# Patient Record
Sex: Female | Born: 1975 | Race: White | Hispanic: No | Marital: Married | Smoking: Current every day smoker
Health system: Southern US, Community
[De-identification: ages and names within clinical notes are randomized; demographics above are authoritative.]

## PROBLEM LIST (undated history)

## (undated) DIAGNOSIS — E162 Hypoglycemia, unspecified: Secondary | ICD-10-CM

## (undated) DIAGNOSIS — F329 Major depressive disorder, single episode, unspecified: Secondary | ICD-10-CM

## (undated) DIAGNOSIS — I471 Supraventricular tachycardia, unspecified: Secondary | ICD-10-CM

## (undated) DIAGNOSIS — Z8489 Family history of other specified conditions: Secondary | ICD-10-CM

## (undated) DIAGNOSIS — F32A Depression, unspecified: Secondary | ICD-10-CM

## (undated) DIAGNOSIS — R51 Headache: Secondary | ICD-10-CM

## (undated) DIAGNOSIS — I499 Cardiac arrhythmia, unspecified: Secondary | ICD-10-CM

## (undated) DIAGNOSIS — R011 Cardiac murmur, unspecified: Secondary | ICD-10-CM

## (undated) DIAGNOSIS — F411 Generalized anxiety disorder: Secondary | ICD-10-CM

## (undated) DIAGNOSIS — D649 Anemia, unspecified: Secondary | ICD-10-CM

## (undated) DIAGNOSIS — F419 Anxiety disorder, unspecified: Secondary | ICD-10-CM

## (undated) DIAGNOSIS — I10 Essential (primary) hypertension: Secondary | ICD-10-CM

## (undated) DIAGNOSIS — K219 Gastro-esophageal reflux disease without esophagitis: Secondary | ICD-10-CM

## (undated) DIAGNOSIS — M4300 Spondylolysis, site unspecified: Secondary | ICD-10-CM

## (undated) HISTORY — DX: Supraventricular tachycardia: I47.1

## (undated) HISTORY — DX: Generalized anxiety disorder: F41.1

## (undated) HISTORY — PX: DILATION AND CURETTAGE OF UTERUS: SHX78

## (undated) HISTORY — PX: TUBAL LIGATION: SHX77

## (undated) HISTORY — DX: Supraventricular tachycardia, unspecified: I47.10

---

## 2003-03-10 ENCOUNTER — Other Ambulatory Visit: Payer: Self-pay

## 2003-05-07 ENCOUNTER — Other Ambulatory Visit: Payer: Self-pay

## 2003-05-17 ENCOUNTER — Other Ambulatory Visit: Payer: Self-pay

## 2003-10-29 ENCOUNTER — Ambulatory Visit: Payer: Self-pay

## 2004-09-14 ENCOUNTER — Ambulatory Visit: Payer: Self-pay

## 2005-10-25 ENCOUNTER — Other Ambulatory Visit: Payer: Self-pay

## 2005-10-25 ENCOUNTER — Emergency Department: Payer: Self-pay | Admitting: Emergency Medicine

## 2005-11-16 ENCOUNTER — Emergency Department: Payer: Self-pay | Admitting: Emergency Medicine

## 2006-01-09 ENCOUNTER — Other Ambulatory Visit: Payer: Self-pay

## 2006-01-09 ENCOUNTER — Emergency Department: Payer: Self-pay

## 2006-01-18 ENCOUNTER — Emergency Department: Payer: Self-pay | Admitting: Emergency Medicine

## 2006-02-09 ENCOUNTER — Ambulatory Visit: Payer: Self-pay | Admitting: Internal Medicine

## 2006-02-26 ENCOUNTER — Ambulatory Visit: Payer: Self-pay | Admitting: Internal Medicine

## 2006-07-08 ENCOUNTER — Encounter: Payer: Self-pay | Admitting: Maternal & Fetal Medicine

## 2006-09-16 ENCOUNTER — Encounter: Payer: Self-pay | Admitting: Maternal & Fetal Medicine

## 2006-10-14 ENCOUNTER — Encounter: Payer: Self-pay | Admitting: Maternal & Fetal Medicine

## 2006-10-14 ENCOUNTER — Observation Stay: Payer: Self-pay | Admitting: Obstetrics and Gynecology

## 2006-11-02 ENCOUNTER — Observation Stay: Payer: Self-pay | Admitting: Obstetrics and Gynecology

## 2006-11-04 ENCOUNTER — Inpatient Hospital Stay: Payer: Self-pay | Admitting: Advanced Practice Midwife

## 2007-03-12 ENCOUNTER — Emergency Department: Payer: Self-pay | Admitting: Emergency Medicine

## 2007-03-12 ENCOUNTER — Other Ambulatory Visit: Payer: Self-pay

## 2007-08-30 ENCOUNTER — Encounter: Payer: Self-pay | Admitting: Orthopedic Surgery

## 2007-09-08 ENCOUNTER — Ambulatory Visit: Payer: Self-pay | Admitting: Internal Medicine

## 2007-09-13 ENCOUNTER — Encounter: Payer: Self-pay | Admitting: Orthopedic Surgery

## 2007-09-21 ENCOUNTER — Ambulatory Visit: Payer: Self-pay | Admitting: Pain Medicine

## 2007-12-12 ENCOUNTER — Emergency Department: Payer: Self-pay | Admitting: Emergency Medicine

## 2008-07-30 ENCOUNTER — Encounter: Admission: RE | Admit: 2008-07-30 | Discharge: 2008-07-30 | Payer: Self-pay | Admitting: Dermatology

## 2008-09-13 ENCOUNTER — Encounter: Payer: Self-pay | Admitting: Internal Medicine

## 2008-09-13 ENCOUNTER — Ambulatory Visit: Payer: Self-pay | Admitting: Family Medicine

## 2009-09-30 ENCOUNTER — Ambulatory Visit: Payer: Self-pay | Admitting: Family Medicine

## 2010-06-30 ENCOUNTER — Encounter: Payer: Self-pay | Admitting: Internal Medicine

## 2010-07-01 ENCOUNTER — Ambulatory Visit (INDEPENDENT_AMBULATORY_CARE_PROVIDER_SITE_OTHER): Payer: Medicare Other | Admitting: Internal Medicine

## 2010-07-01 ENCOUNTER — Encounter: Payer: Self-pay | Admitting: Internal Medicine

## 2010-07-01 VITALS — BP 123/77 | HR 78 | Resp 12 | Ht 66.0 in | Wt 178.0 lb

## 2010-07-01 DIAGNOSIS — I341 Nonrheumatic mitral (valve) prolapse: Secondary | ICD-10-CM | POA: Insufficient documentation

## 2010-07-01 DIAGNOSIS — M549 Dorsalgia, unspecified: Secondary | ICD-10-CM

## 2010-07-01 DIAGNOSIS — I059 Rheumatic mitral valve disease, unspecified: Secondary | ICD-10-CM

## 2010-07-01 DIAGNOSIS — I471 Supraventricular tachycardia: Secondary | ICD-10-CM

## 2010-07-01 DIAGNOSIS — I498 Other specified cardiac arrhythmias: Secondary | ICD-10-CM

## 2010-07-01 MED ORDER — METOPROLOL SUCCINATE ER 50 MG PO TB24: 50.0000 mg | ORAL_TABLET | Freq: Every day | ORAL | Status: DC

## 2010-07-01 NOTE — Assessment & Plan Note (Signed)
As above.

## 2010-07-01 NOTE — Progress Notes (Signed)
HPI: April Guzman is a 35 y.o. female Seen at the request of Dr. Harl Bowie for recurrent abrupt onset onset tachypalpitations dating back approximately 20 years. These have been occurring with increasing frequency and of longer duration. Initially we yearly lasting a minute or 2. They are  Now daily lasting up to 45 minutes. They're associated with chest pressure shortness of breath presyncope and 01 occasion about a year ago with syncope. There is one documented episode apparently by [facility]. Heart was 240 beats per minute. She is being given adenosine on a couple occasions with termination.  She has no problems with exercise intolerance. She has had an echo that showed mitral valve prolapse and mitral regurgitation apparently. She does not use caffeine or other stimulants.  She has been on she thinks as many as 10 medications for control of her palpitations. Current Outpatient Prescriptions  Medication Sig Dispense Refill  . citalopram (CELEXA) 20 MG tablet Take 20 mg by mouth daily.        . ergocalciferol (VITAMIN D2) 50000 UNITS capsule Take 50,000 Units by mouth once a week.        . nadolol (CORGARD) 20 MG tablet Take 20 mg by mouth daily.        . vitamin B-12 (CYANOCOBALAMIN) 1000 MCG tablet Take 1,000 mcg by mouth daily.          No Known Allergies  Past Medical History  Diagnosis Date  . Palpitations   . Generalized anxiety disorder   . Atrial tachycardia   . Vitamin D deficiency     No past surgical history on file.  No family history on file.  History   Social History  . Marital Status: Married    Spouse Name: N/A    Number of Children: N/A  . Years of Education: N/A   Occupational History  . Not on file.   Social History Main Topics  . Smoking status: Former Games developer  . Smokeless tobacco: Not on file  . Alcohol Use: Not on file  . Drug Use: Not on file  . Sexually Active: Not on file   Other Topics Concern  . Not on file   Social History Narrative    . No narrative on file    Fourteen point review of systems was negative except as noted in HPI and PMH   PHYSICAL EXAMINATION  Blood pressure 123/77, pulse 78, resp. rate 12, height 5\' 6"  (1.676 m), weight 178 lb (80.74 kg).   Well developed and nourished in no acute distress HENT normal Neck supple with JVP-flat Carotids brisk and full without bruits Back without scoliosis or kyphosis Clear Regular rate and rhythm, no murmurs or gallops Abd-soft with active BS without hepatomegaly or midline pulsation Femoral pulses 2+ distal pulses intact No Clubbing cyanosis edema Skin-warm and dry LN-neg submandibular and supraclavicular A & Oriented CN 3-12 normal  Grossly normal sensory and motor function Affect engaging .   Electrocardiogram today demonstrated sinus rhythm at 90 with intervals of 0.13/0.08/0.38 axis is 44

## 2010-07-01 NOTE — Patient Instructions (Signed)
Your physician has recommended you make the following change in your medication:  1) Stop nadolol 2) Start metoprolol succ (toprol) 50mg  one tablet by mouth daily  Your physician recommends that you schedule a follow-up appointment in: 4 weeks in [place].

## 2010-07-01 NOTE — Assessment & Plan Note (Signed)
If in fact the patient has mitral valve prolapse, it might suggest its lateral accessory pathway. Would like to review the echo.

## 2010-07-01 NOTE — Assessment & Plan Note (Signed)
She has recurrent SVT by history. It is edematous and responsive by EMS confirming the hypothesis. This is true or not withstanding its non-documentation. 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  We discussed multiple options including alternative beta blockers and calcium blockers, antiarrhythmic drugs in the possibility of arrhythmia, and catheter ablation. We discussed the potential benefits as well as the risks including but not limited to death perforation heart block myocardial infarction and vascular injury. Given the fact that she has disabling back pain, she would need to be done under general anesthesia.  At this point she would like to try metoprolol succinate which he has not previously been on. She may well want to try an antiarrhythmic drug given the stress of having a procedure is almost overwhelming to her at this time.

## 2010-08-05 ENCOUNTER — Ambulatory Visit (INDEPENDENT_AMBULATORY_CARE_PROVIDER_SITE_OTHER): Payer: Medicare Other | Admitting: Internal Medicine

## 2010-08-05 ENCOUNTER — Encounter: Payer: Self-pay | Admitting: Internal Medicine

## 2010-08-05 VITALS — BP 137/84 | HR 61 | Ht 65.0 in | Wt 172.0 lb

## 2010-08-05 DIAGNOSIS — I498 Other specified cardiac arrhythmias: Secondary | ICD-10-CM

## 2010-08-05 DIAGNOSIS — I471 Supraventricular tachycardia: Secondary | ICD-10-CM

## 2010-08-05 MED ORDER — VERAPAMIL HCL ER 120 MG PO TBCR: 120.0000 mg | EXTENDED_RELEASE_TABLET | Freq: Every day | ORAL | Status: DC

## 2010-08-05 MED ORDER — PROPRANOLOL HCL ER 60 MG PO CP24: 60.0000 mg | ORAL_CAPSULE | Freq: Every day | ORAL | Status: DC

## 2010-08-05 MED ORDER — METOPROLOL SUCCINATE ER 50 MG PO TB24: ORAL_TABLET | ORAL | Status: DC

## 2010-08-05 NOTE — Progress Notes (Signed)
  HPI  April Guzman is a 35 y.o. 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  She  Is having much less SVT on toprol but is quite fatigued and not well able to exercise, She continues to deal with the stress of divorced parents, herself separating from her husband and 4 children  Past Medical History  Diagnosis Date  . Palpitations   . Generalized anxiety disorder   . Atrial tachycardia   . Vitamin D deficiency     No past surgical history on file.  Current Outpatient Prescriptions  Medication Sig Dispense Refill  . citalopram (CELEXA) 20 MG tablet Take 20 mg by mouth daily.        . ergocalciferol (VITAMIN D2) 50000 UNITS capsule Take 50,000 Units by mouth once a week.        . metoprolol (TOPROL-XL) 50 MG 24 hr tablet Take 1 tablet (50 mg total) by mouth daily.  30 tablet  6  . vitamin B-12 (CYANOCOBALAMIN) 1000 MCG tablet Take 1,000 mcg by mouth daily.          No Known Allergies  Review of Systems negative except from HPI and PMH  Physical Exam Well developed and well nourished in no acute distress HENT normal E scleral and icterus clear Neck Supple JVP flat; carotids brisk and full Clear to ausculation Regular rate and rhythm, no murmurs gallops or rub Soft with active bowel sounds No clubbing cyanosis and edema Alert and oriented, grossly normal motor and sensory function Skin Warm and Dry  ECG  Assessment and  Plan

## 2010-08-05 NOTE — Patient Instructions (Addendum)
1. Start taking Toprol 1/2 tablet daily to see if this will help for the SVT, if not then go with number 2. 2. Start Inderal LA 60 mg take one tablet daily. If this does not help with the SVT then try number 3. 3. Start Verapamil CR 120 mg take one tablet daily.   Follow up in 8-10 weeks with Dr. Graciela Husbands.

## 2010-08-05 NOTE — Assessment & Plan Note (Signed)
She continues to have spells and is leaning towards ablation as she continues to find drugs unsatisfactory.  We will try the following 1) decrease toprol to 25 mg 2) try inderal la 60 if the above doesn't satisfy, and if inderal fails 3) verapamil 120  We will revisit ablation at her next visit

## 2010-10-10 ENCOUNTER — Encounter: Payer: Self-pay | Admitting: Internal Medicine

## 2010-10-10 ENCOUNTER — Ambulatory Visit (INDEPENDENT_AMBULATORY_CARE_PROVIDER_SITE_OTHER): Payer: Medicare Other | Admitting: Internal Medicine

## 2010-10-10 VITALS — BP 149/97 | HR 69 | Ht 66.0 in | Wt 177.0 lb

## 2010-10-10 DIAGNOSIS — I498 Other specified cardiac arrhythmias: Secondary | ICD-10-CM

## 2010-10-10 DIAGNOSIS — R030 Elevated blood-pressure reading, without diagnosis of hypertension: Secondary | ICD-10-CM

## 2010-10-10 DIAGNOSIS — I471 Supraventricular tachycardia: Secondary | ICD-10-CM

## 2010-10-10 NOTE — Progress Notes (Signed)
HPI: April Guzman is a 35 y.o. female [place] for recurrent abrupt onset onset tachypalpitations dating back approximately 20 years. These have been occurring with increasing frequency and of longer duration. Initially we yearly lasting a minute or 2. They are  Now daily lasting up to 45 minutes. They're associated with chest pressure shortness of breath presyncope and on 1 occasion about a year ago with syncope. There is one documented episode apparently by [facility]. Heart was 240 beats per minute. She is being given adenosine on a couple occasions with termination.  They are frog neg and diuretic negative  She has no problems with exercise intolerance. She has had an echo that showed mitral valve prolapse and mitral regurgitation apparently. She does not use caffeine or other stimulants.  She has recently noted LH following vigorous walking and with standing  She has been on she thinks as many as 10 medications for control of her palpitations. Current Outpatient Prescriptions  Medication Sig Dispense Refill  . citalopram (CELEXA) 20 MG tablet Take 20 mg by mouth daily.        . ergocalciferol (VITAMIN D2) 50000 UNITS capsule Take 50,000 Units by mouth once a week.        . metoprolol (TOPROL-XL) 50 MG 24 hr tablet Take 1/2 tablet daily.  30 tablet  6  . vitamin B-12 (CYANOCOBALAMIN) 1000 MCG tablet Take 1,000 mcg by mouth daily.        . propranolol (INDERAL LA) 60 MG 24 hr capsule Take 1 capsule (60 mg total) by mouth daily.  30 capsule  6  . verapamil (CALAN-SR) 120 MG CR tablet Take 1 tablet (120 mg total) by mouth daily.  30 tablet  6    No Known Allergies  Past Medical History  Diagnosis Date  . Generalized anxiety disorder   . Vitamin D deficiency   . Supraventricular tachycardia     prob conealed accessory pathway;  previous drugs atenolol, nadolol, metoprolol Tartrate; diltiazem    History reviewed. No pertinent past surgical history.  History reviewed. No  pertinent family history.  History   Social History  . Marital Status: Married    Spouse Name: N/A    Number of Children: N/A  . Years of Education: N/A   Occupational History  . Not on file.   Social History Main Topics  . Smoking status: Former Games developer  . Smokeless tobacco: Not on file  . Alcohol Use: No  . Drug Use: No  . Sexually Active: Not on file   Other Topics Concern  . Not on file   Social History Narrative  . No narrative on file    Fourteen point review of systems was negative except as noted in HPI and PMH   PHYSICAL EXAMINATION  Blood pressure 149/97, pulse 69, height 5\' 6"  (1.676 m), weight 177 lb (80.287 kg).   Well developed and nourished in no acute distress HENT normal Neck supple with JVP-flat Carotids brisk and full without bruits Back without scoliosis or kyphosis Clear Regular rate and rhythm, no murmurs or gallops Abd-soft with active BS without hepatomegaly or midline pulsation Femoral pulses 2+ distal pulses intact No Clubbing cyanosis edema Skin-warm and dry LN-neg submandibular and supraclavicular A & Oriented CN 3-12 normal  Grossly normal sensory and motor function Affect engaging .   Electrocardiogram today demonstrated sinus rhythm without preexcitation

## 2010-10-10 NOTE — Assessment & Plan Note (Signed)
Will follw

## 2010-10-10 NOTE — Assessment & Plan Note (Addendum)
She continues to lean towards ablation with fatigue will switvch to verapamil and she will let us know prob concealed accessory pathway  Would prob need to be done under [place] with back issues

## 2010-10-10 NOTE — Patient Instructions (Signed)
The patient will try the verapamil and call if not helping to set up an ablation.

## 2010-11-18 ENCOUNTER — Telehealth: Payer: Self-pay | Admitting: Cardiovascular Disease

## 2010-11-18 NOTE — Telephone Encounter (Signed)
Pt calling to schedule an ablation with Dr Graciela Husbands

## 2010-11-19 ENCOUNTER — Other Ambulatory Visit: Payer: Self-pay | Admitting: Internal Medicine

## 2010-11-19 NOTE — Telephone Encounter (Signed)
Spoke to pt this AM, she was given Verapamil at last ov and was told to call if this was not helping. She says her HR at times is "so slow I cannot function, and then if I do anything active it flies." Pt wants to go ahead with SVT ablation. Notified pt Dr. Graciela Husbands in office today, I will discuss date/time and will call her back with details.

## 2010-11-20 NOTE — Telephone Encounter (Signed)
Per Dr. Graciela Husbands, pt is scheduled for SVT ablation 11/21. Attempted to contact pt, LMOM TCB. Will have pt come in for labs/instructions on 11/15.

## 2010-11-20 NOTE — Telephone Encounter (Signed)
Pt called stating she will be out of town for Thanksgiving and forgot this. She would like to r/s for 11/29. Notified pt I will r/s and call her back with details.

## 2010-11-21 ENCOUNTER — Encounter: Payer: Self-pay | Admitting: *Deleted

## 2010-11-21 ENCOUNTER — Telehealth: Payer: Self-pay | Admitting: *Deleted

## 2010-11-21 DIAGNOSIS — Z01810 Encounter for preprocedural cardiovascular examination: Secondary | ICD-10-CM

## 2010-11-21 DIAGNOSIS — I471 Supraventricular tachycardia: Secondary | ICD-10-CM

## 2010-11-21 NOTE — Telephone Encounter (Signed)
Called pt to verify meds prior to her SVT ablation. Pt states she is not taking verapamil, or metoprolol anymore since no medications have worked. Will update her med list.

## 2010-12-02 ENCOUNTER — Other Ambulatory Visit: Payer: Self-pay | Admitting: *Deleted

## 2010-12-02 ENCOUNTER — Encounter (HOSPITAL_COMMUNITY): Payer: Self-pay | Admitting: Pharmacist

## 2010-12-02 DIAGNOSIS — I471 Supraventricular tachycardia: Secondary | ICD-10-CM

## 2010-12-08 ENCOUNTER — Telehealth: Payer: Self-pay | Admitting: *Deleted

## 2010-12-08 ENCOUNTER — Ambulatory Visit (INDEPENDENT_AMBULATORY_CARE_PROVIDER_SITE_OTHER): Payer: Medicare Other | Admitting: *Deleted

## 2010-12-08 DIAGNOSIS — Z01810 Encounter for preprocedural cardiovascular examination: Secondary | ICD-10-CM

## 2010-12-08 DIAGNOSIS — I498 Other specified cardiac arrhythmias: Secondary | ICD-10-CM

## 2010-12-08 DIAGNOSIS — I471 Supraventricular tachycardia: Secondary | ICD-10-CM

## 2010-12-08 NOTE — Telephone Encounter (Signed)
Patient is scheduled for an ablation on Thursday with Dr Graciela Husbands for SVT.  Due to scheduling issues, Dr Graciela Husbands will be out of the lab that day.  Called and offered patient to have the procedure done as scheduled with Dr Ladona Ridgel.  She wants to think about it.  She will call Aundra Millet in the [place] office with her decision.

## 2010-12-09 LAB — CBC WITH DIFFERENTIAL
Eos: 4 % (ref 0–7)
Eosinophils Absolute: 0.2 10*3/uL (ref 0.0–0.4)
Immature Grans (Abs): 0 10*3/uL (ref 0.0–0.1)
Immature Granulocytes: 0 % (ref 0–2)
Lymphs: 28 % (ref 14–46)
MCH: 26.1 pg — ABNORMAL LOW (ref 26.6–33.0)
Monocytes Absolute: 0.5 10*3/uL (ref 0.1–1.0)
Neutrophils Relative %: 58 % (ref 40–74)
Platelets: 339 10*3/uL (ref 140–415)
RBC: 4.48 x10E6/uL (ref 3.77–5.28)

## 2010-12-09 LAB — BASIC METABOLIC PANEL
BUN/Creatinine Ratio: 29 — ABNORMAL HIGH (ref 8–20)
BUN: 17 mg/dL (ref 6–20)
CO2: 18 mmol/L — ABNORMAL LOW (ref 20–32)
Calcium: 9 mg/dL (ref 8.7–10.2)
Chloride: 106 mmol/L (ref 97–108)
Creatinine, Ser: 0.59 mg/dL (ref 0.57–1.00)
Glucose: 80 mg/dL (ref 65–99)

## 2010-12-09 LAB — PROTIME-INR: INR: 0.9 (ref 0.8–1.2)

## 2010-12-10 NOTE — Telephone Encounter (Signed)
Patient decided to leave case as scheduled.  Dr Ladona Ridgel will meet with her in short stay prior to procedure to answer any questions.

## 2010-12-11 ENCOUNTER — Encounter (HOSPITAL_COMMUNITY)
Admission: RE | Disposition: A | Discharge status: Home / Self Care | Payer: Self-pay | Source: Ambulatory Visit | Attending: Internal Medicine

## 2010-12-11 ENCOUNTER — Encounter (HOSPITAL_COMMUNITY): Payer: Self-pay | Admitting: General Practice

## 2010-12-11 ENCOUNTER — Ambulatory Visit (HOSPITAL_COMMUNITY)
Admission: RE | Admit: 2010-12-11 | Discharge: 2010-12-11 | Disposition: A | Discharge status: Home / Self Care | Payer: Medicare Other | Source: Ambulatory Visit | Attending: Internal Medicine | Admitting: Internal Medicine

## 2010-12-11 ENCOUNTER — Encounter (HOSPITAL_COMMUNITY): Payer: Self-pay

## 2010-12-11 ENCOUNTER — Ambulatory Visit (HOSPITAL_COMMUNITY): Payer: Medicare Other

## 2010-12-11 ENCOUNTER — Encounter (HOSPITAL_COMMUNITY): Payer: Self-pay | Admitting: Certified Registered"

## 2010-12-11 DIAGNOSIS — E559 Vitamin D deficiency, unspecified: Secondary | ICD-10-CM | POA: Insufficient documentation

## 2010-12-11 DIAGNOSIS — I498 Other specified cardiac arrhythmias: Secondary | ICD-10-CM | POA: Insufficient documentation

## 2010-12-11 DIAGNOSIS — F411 Generalized anxiety disorder: Secondary | ICD-10-CM | POA: Insufficient documentation

## 2010-12-11 DIAGNOSIS — I471 Supraventricular tachycardia: Secondary | ICD-10-CM

## 2010-12-11 DIAGNOSIS — E162 Hypoglycemia, unspecified: Secondary | ICD-10-CM

## 2010-12-11 HISTORY — DX: Anemia, unspecified: D64.9

## 2010-12-11 HISTORY — DX: Hypoglycemia, unspecified: E16.2

## 2010-12-11 HISTORY — PX: SUPRAVENTRICULAR TACHYCARDIA ABLATION: SHX5492

## 2010-12-11 HISTORY — DX: Essential (primary) hypertension: I10

## 2010-12-11 HISTORY — PX: CARDIAC ELECTROPHYSIOLOGY MAPPING AND ABLATION: SHX1292

## 2010-12-11 HISTORY — DX: Anxiety disorder, unspecified: F41.9

## 2010-12-11 HISTORY — DX: Cardiac murmur, unspecified: R01.1

## 2010-12-11 HISTORY — DX: Headache: R51

## 2010-12-11 SURGERY — SUPRAVENTRICULAR TACHYCARDIA ABLATION
Anesthesia: General

## 2010-12-11 MED ORDER — MIDAZOLAM HCL 5 MG/5ML IJ SOLN
INTRAMUSCULAR | Status: DC | PRN
  Administered 2010-12-11 (×2): 2 mg via INTRAVENOUS
  Administered 2010-12-11 (×2): 1 mg via INTRAVENOUS

## 2010-12-11 MED ORDER — PROPOFOL 10 MG/ML IV EMUL
INTRAVENOUS | Status: DC | PRN
  Administered 2010-12-11: 75 ug/kg/min via INTRAVENOUS

## 2010-12-11 MED ORDER — LACTATED RINGERS IV SOLN: INTRAVENOUS | Status: DC

## 2010-12-11 MED ORDER — BUPIVACAINE HCL (PF) 0.25 % IJ SOLN
INTRAMUSCULAR | Status: AC
  Filled 2010-12-11: qty 30

## 2010-12-11 MED ORDER — SODIUM CHLORIDE 0.9 % IV SOLN: INTRAVENOUS | Status: DC

## 2010-12-11 MED ORDER — HEPARIN (PORCINE) IN NACL 2-0.9 UNIT/ML-% IJ SOLN
INTRAMUSCULAR | Status: AC
  Filled 2010-12-11: qty 1000

## 2010-12-11 MED ORDER — ACETAMINOPHEN 325 MG PO TABS
650.0000 mg | ORAL_TABLET | Freq: Four times a day (QID) | ORAL | Status: DC | PRN
  Filled 2010-12-11: qty 2

## 2010-12-11 MED ORDER — CITALOPRAM HYDROBROMIDE 20 MG PO TABS
20.0000 mg | ORAL_TABLET | Freq: Every day | ORAL | Status: DC
  Filled 2010-12-11: qty 1

## 2010-12-11 MED ORDER — LACTATED RINGERS IV SOLN
INTRAVENOUS | Status: DC | PRN
  Administered 2010-12-11: 11:00:00 via INTRAVENOUS

## 2010-12-11 MED ORDER — FENTANYL CITRATE 0.05 MG/ML IJ SOLN
INTRAMUSCULAR | Status: DC | PRN
  Administered 2010-12-11 (×2): 25 ug via INTRAVENOUS
  Administered 2010-12-11: 100 ug via INTRAVENOUS

## 2010-12-11 MED ORDER — HYDROXYUREA 500 MG PO CAPS
ORAL_CAPSULE | ORAL | Status: AC
  Filled 2010-12-11: qty 1

## 2010-12-11 MED ORDER — ACETAMINOPHEN 325 MG PO TABS
650.0000 mg | ORAL_TABLET | ORAL | Status: DC | PRN
  Administered 2010-12-11: 650 mg via ORAL

## 2010-12-11 MED ORDER — ONDANSETRON HCL 4 MG/2ML IJ SOLN: 4.0000 mg | Freq: Four times a day (QID) | INTRAMUSCULAR | Status: DC | PRN

## 2010-12-11 NOTE — Op Note (Signed)
NAMECAMARIE, April Guzman                  ACCOUNT NO.:  [id number]  MEDICAL RECORD NO.:  [id number]  LOCATION:  2006                         FACILITY:  [facility]  PHYSICIAN:  Doylene Canning. 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  INDUCTION:  The patient is a very pleasant 35 year old woman with a history of tachy-palpitations and documented SVT at rates of over 200 beats per minute.  They start and stop suddenly, and they have been refractory to medical therapy.  She is now referred for catheter ablation.  PROCEDURE:  After informed consent was obtained, the patient was taken to the diagnostic EP lab in a fasting state.  After usual preparation and draping, intravenous fentanyl and midazolam were given for sedation. In addition, propofol was administered by the nurse anesthetist.  A 6- French hexapolar catheter was inserted percutaneously into the right jugular vein and advanced to the coronary sinus.  A 6-French quadripolar catheter was inserted percutaneously in the right femoral vein and advanced to the right ventricle.  A 6-French quadripolar catheter was inserted percutaneously in the right femoral vein and advanced to the His-bundle region.  After measurement of basic intervals, rapid ventricular pacing was carried out from the right ventricle and stepwise decreased down to 320 msec where VA Wenckebach was observed.  During rapid ventricular pacing, the atrial activation sequence was midline and decremental.  Programmed ventricular stimulation was carried out in the right ventricle at a base drive cycle length of 161 msec.  The S1-S2 interval stepwise decreased from 400 msec down to 220 msec where ventricular refractoriness was observed.  During programmed ventricular stimulation, the  atrial activation again remained midline and decremental.  Programmed atrial stimulation was carried out in the coronary sinus at a base drive cycle length of 096 msec.  The S1-S2 interval stepwise decreased from 440 msec down to 260 msec.  During programmed atrial stimulation, there were multiple AH jumps, echo beats, and double echo beats.  There was no inducible SVT initially.  Rapid atrial pacing was carried out in the coronary sinus at a base drive cycle length of 045 msec and stepwise decreased down to 410 msec where AV Wenckebach was observed.  During rapid atrial pacing, the PR interval was greater than the RR interval, but there was no inducible SVT.  Isoproterenol was infused at rates between 1 and 4 mics per minute. Rapid atrial pacing was then carried out resulting in the initiation of SVT.  This was a narrow QRS tachycardia at a rate of 200-210 beats per minute.  Review of the atrial electrograms during tachycardia demonstrated a very short VA interval and midline atrial activation. PVCs were then placed at the time of His-bundle refractoriness, which did not preexcite the atrium.  Ventricular pacing during SVT resulted in a VA-AV conduction sequence.  At times, ventricular pacing would terminate the tachycardia.  With the above findings, a diagnosis of AV node reentrant tachycardia was made.  A 7-French quadripolar ablation catheter  was then maneuvered percutaneously through the right femoral vein and advanced into the right atrium.  Mapping was carried out.  A total of 5 RF energy applications were given Koch's triangle between site 7 and 8.  During RF energy application, there was accelerated junctional rhythm.  Following this, the patient was observed for approximately 30 minutes.  During this time, additional rapid atrial pacing and programmed atrial stimulation and rapid ventricular pacing and programmed ventricular stimulation were carried out and demonstrated no  inducible SVT.  In addition, the PR interval was now less than the RR interval.  There were no jumps and there were no echoes noted.  The catheters were then removed.  Hemostasis was assured.  The patient was returned to her room in satisfactory condition.  COMPLICATIONS:  There were no immediate procedure complications.  RESULTS:  A.  Baseline ECG.  Baseline ECG demonstrates sinus rhythm with normal axis intervals.  There was no preexcitation. B.  Baseline intervals.  Sinus node cycle length was 837 msec.  The QRS duration was 90 msec.  The PR interval was 140 msec.  The AAH interval was 83 msec and the HV interval was 36 msec. C.  Rapid ventricular pacing.  Rapid ventricular pacing was carried out in the right ventricle, demonstrating a VA Wenckebach cycle length of 320 msec.  During rapid ventricular pacing, the atrial activation sequence was midline and decremental. D. Programmed ventricular stimulation.  Programmed ventricular stimulation was carried out from the right ventricle at a base drive cycle length of 161 msec.  The S1-S2 interval stepwise decreased from 440 msec down to 220 msec where the ventricular refractoriness was observed.  During programmed ventricular stimulation, the atrial activation remained midline and decremental.  There were VA jumps noted. E.  Rapid atrial pacing.  Rapid atrial pacing was carried out from the coronary sinus at a base drive cycle length of 096 msec and stepwise decreased down to 410 msec where AV Wenckebach was observed.  Following the initiation of isoproterenol, rapid atrial pacing was carried out resulting in the initiation of SVT. F.  Programmed atrial stimulation.  Programmed atrial stimulation was carried out from the coronary sinus at a base drive cycle length of 045 msec.  The S1-S2 interval was stepwise decreased down to 270 msec with AV node ERP was observed.  During programmed atrial stimulation, the PR interval was greater than  the RR interval, and there was double and triple echo beats.  Once isoproterenol was infused, programmed atrial stimulation resulted in induction of SVT. G.  Arrhythmias observed. 1. AV node reentrant tachycardia initiation was with rapid atrial     pacing on isoproterenol.  Duration was sustained.  Termination was     ventricular pacing.     a.     Mapping.  Mapping of Koch's triangle demonstrated usual size      and orientation.     b.     RF energy application.  Total of 5 RF energy applications      were delivered to sites 7 through 9 in Koch's triangle.  During RF      energy application, there was prolonged accelerated junctional      rhythm.  CONCLUSION:  This study demonstrates successful electrophysiologic study and RF catheter ablation of inducible AV node reentry tachycardia with a total of 5 RF energy applications delivered to sites 7 through 8 Koch's triangle.  This resulted in accelerated junctional rhythm, rendering the SVT noninducible, and rendering the slow pathway  conduction absent.     Doylene Canning. Ladona Ridgel, MD    GWT/MEDQ  D:  12/11/2010  T:  12/11/2010  Job:  [id number]  cc:   Duke Salvia, MD, [facility]

## 2010-12-11 NOTE — Discharge Summary (Signed)
   Discharge Summary   Patient ID: April Guzman MRN: [id number], DOB/AGE: 37-Jun-1977 37 y.o. Admit date: 12/11/2010 D/C date:     12/11/2010   Primary Discharge Diagnoses:  1. SVT s/p ablation 12/11/10  Secondary Discharge Diagnoses:  1. Generalized anxiety disorder 2. Vit D deficiency  Hospital Course: April Guzman is a 35 y/o F with history of SVT who presented with increasing palpitations in frequency and duration. They have been associated with chest pressure, SOB, and presyncope and on one occasion with syncope. She has been given adenosine on a couple occasions with termination. They are frog negative and diuretic negative. Dr. Ladona Ridgel felt she was a good candidate for SVT ablation and brought her in for this procedure 12/11/10. She underwent it successfully and did well post-op, ambulating without difficulty. Dr. Ladona Ridgel has seen & examined her and feels she is stable for discharge.  He feels she does not need any AV nodal blocking agents at discharge. She was previously on them but has not been on them recently, so no changes were made to home med list. HyperK+ noted on pre-admit labs -- has been addressed by office by RN, sent to MD desktop.   Discharge Vitals: Blood pressure 109/64, pulse 77, temperature 98.8 F (37.1 C), temperature source Oral, resp. rate 18, height 5\' 5"  (1.651 m), weight 177 lb (80.287 kg), last menstrual period 12/08/2010, SpO2 99.00%.  Labs: Lab Results  Component Value Date   WBC 5.1 12/08/2010   HGB 11.7 12/08/2010   HCT 35.3 12/08/2010   MCV 79 12/08/2010   PLT 339 12/08/2010    Lab 12/08/10 0842  NA 141  K 5.4*  CL 106  CO2 18*  BUN 17  CREATININE 0.59  CALCIUM 9.0  PROT --  BILITOT --  ALKPHOS --  ALT --  AST --  GLUCOSE 80    Diagnostic Studies/Procedures:  1. SVT ablation  Discharge Medications   Current Discharge Medication List    CONTINUE these medications which have NOT CHANGED   Details  acetaminophen (TYLENOL) 325 MG tablet  Take 650 mg by mouth every 6 (six) hours as needed. For headache and pain     citalopram (CELEXA) 20 MG tablet Take 20 mg by mouth daily.       STOP taking these medications     CYANOCOBALAMIN PO         Disposition   The patient will be discharged in stable condition to home. Discharge Orders    Future Orders Please Complete By Expires   Diet - low sodium heart healthy      Increase activity slowly      Comments:   No driving for 1 day. No lifting over 5 lbs for 3 days.   Discharge wound care:      Comments:   Keep procedure site clean & dry. If you notice increased pain, swelling, bleeding or pus, call/return!  You may shower, but no soaking baths/hot tubs/pools for 1 week.       Follow-up Information    Follow up with Sherryl Manges, MD. (our office will call you)    Contact information:   [address], [address] [place] [address] [phone]            Duration of Discharge Encounter: Greater than 30 minutes including physician and PA time.  Signed, Ronie Spies PA-C 12/11/2010, 7:56 PM

## 2010-12-11 NOTE — Telephone Encounter (Signed)
Ok noted, thanks

## 2010-12-11 NOTE — Anesthesia Postprocedure Evaluation (Signed)
  Anesthesia Post-op Note  Patient: April Guzman  Procedure(s) Performed:  SUPRAVENTRICULAR TACHYCARDIA ABLATION  Patient Location: PACU  Anesthesia Type: MAC  Level of Consciousness: awake  Airway and Oxygen Therapy: Patient Spontanous Breathing  Post-op Pain: none  Post-op Assessment: Post-op Vital signs reviewed  Post-op Vital Signs: stable  Complications: No apparent anesthesia complications

## 2010-12-11 NOTE — H&P (Signed)
April Guzman is a 35 y.o. female  Who presents for evaluation and catheter ablation of SVT. She has a h/o recurrent abrupt onset onset tachypalpitations dating back approximately 20 years. These have been occurring with increasing frequency and of longer duration. Initially we yearly lasting a minute or 2. They are Now daily lasting up to 45 minutes. They're associated with chest pressure shortness of breath presyncope and on 1 occasion about a year ago with syncope. There is one documented episode apparently by [facility]. Heart was 240 beats per minute. She is being given adenosine on a couple occasions with termination. They are frog neg and diuretic negative  She has no problems with exercise intolerance. She has had an echo that showed mitral valve prolapse and mitral regurgitation apparently. She does not use caffeine or other stimulants.  She has recently noted LH following vigorous walking and with standing  She has been on she thinks as many as 10 medications for control of her palpitations.  Current Outpatient Prescriptions   Medication  Sig  Dispense  Refill   .  citalopram (CELEXA) 20 MG tablet  Take 20 mg by mouth daily.     .  ergocalciferol (VITAMIN D2) 50000 UNITS capsule  Take 50,000 Units by mouth once a week.     .  metoprolol (TOPROL-XL) 50 MG 24 hr tablet  Take 1/2 tablet daily.  30 tablet  6   .  vitamin B-12 (CYANOCOBALAMIN) 1000 MCG tablet  Take 1,000 mcg by mouth daily.     .  propranolol (INDERAL LA) 60 MG 24 hr capsule  Take 1 capsule (60 mg total) by mouth daily.  30 capsule  6   .  verapamil (CALAN-SR) 120 MG CR tablet  Take 1 tablet (120 mg total) by mouth daily.  30 tablet  6    No Known Allergies  Past Medical History   Diagnosis  Date   .  Generalized anxiety disorder    .  Vitamin D deficiency    .  Supraventricular tachycardia      prob conealed accessory pathway; previous drugs atenolol, nadolol, metoprolol Tartrate; diltiazem    History reviewed. No  pertinent past surgical history.  History reviewed. No pertinent family history.  History    Social History   .  Marital Status:  Married     Spouse Name:  N/A     Number of Children:  N/A   .  Years of Education:  N/A    Occupational History   .  Not on file.    Social History Main Topics   .  Smoking status:  Former Games developer   .  Smokeless tobacco:  Not on file   .  Alcohol Use:  No   .  Drug Use:  No   .  Sexually Active:  Not on file    Other Topics  Concern   .  Not on file    Social History Narrative   .  No narrative on file    Fourteen point review of systems was negative except as noted in HPI and PMH  PHYSICAL EXAMINATION  Blood pressure 149/97, pulse 69, height 5\' 6"  (1.676 m), weight 177 lb (80.287 kg).  Well developed and nourished in no acute distress  HENT normal  Neck supple with JVP-flat  Carotids brisk and full without bruits  Back without scoliosis or kyphosis  Clear  Regular rate and rhythm, no murmurs or gallops  Abd-soft with active  BS without hepatomegaly or midline pulsation  Femoral pulses 2+ distal pulses intact  No Clubbing cyanosis edema  Skin-warm and dry  LN-neg submandibular and supraclavicular  A & Oriented CN 3-12 normal Grossly normal sensory and motor function  Affect engaging  . ECG - reviewed. NSR without pre-excitation.  A/P 1. SVT - I have discussed the risks/benefits/goals/expectations of SVT ablation with the patient and her family and she wishes to proceed.  2. Back Pain - with her h/o back pain, will ask the anesthesia service if they can help [place] by providing moderate to deep sedation.

## 2010-12-11 NOTE — Preoperative (Signed)
Beta Blockers   Reason not to administer Beta Blockers:Not Applicable 

## 2010-12-11 NOTE — Interval H&P Note (Signed)
History and Physical Interval Note:  12/11/2010 10:35 AM  April Guzman  has presented today for surgery, with the diagnosis of svt  The various methods of treatment have been discussed with the patient and family. After consideration of risks, benefits and other options for treatment, the patient has consented to  Procedure(s): SUPRAVENTRICULAR TACHYCARDIA ABLATION as a surgical intervention .  The patients' history has been reviewed, patient examined, no change in status, stable for surgery.  I have reviewed the patients' chart and labs.  Questions were answered to the patient's satisfaction.     Lewayne Bunting

## 2010-12-11 NOTE — Anesthesia Preprocedure Evaluation (Addendum)
Anesthesia Evaluation  Patient identified by MRN, date of birth, ID band Patient awake    Reviewed: Allergy & Precautions, H&P , NPO status , Patient's Chart, lab work & pertinent test results, reviewed documented beta blocker date and time   Airway Mallampati: I TM Distance: >3 FB Neck ROM: Full  Mouth opening: Limited Mouth Opening  Dental  (+) Teeth Intact   Pulmonary neg pulmonary ROS,  clear to auscultation        Cardiovascular Regular Normal- Systolic murmurs SVT   Neuro/Psych PSYCHIATRIC DISORDERS Anxiety Negative Neurological ROS     GI/Hepatic negative GI ROS, Neg liver ROS,   Endo/Other  Negative Endocrine ROS  Renal/GU negative Renal ROS     Musculoskeletal   Abdominal   Peds  Hematology   Anesthesia Other Findings   Reproductive/Obstetrics                          Anesthesia Physical Anesthesia Plan  ASA: II  Anesthesia Plan: MAC   Post-op Pain Management:    Induction:   Airway Management Planned: Simple Face Mask, Nasal Cannula and Natural Airway  Additional Equipment:   Intra-op Plan:   Post-operative Plan:   Informed Consent: I have reviewed the patients History and Physical, chart, labs and discussed the procedure including the risks, benefits and alternatives for the proposed anesthesia with the patient or authorized representative who has indicated his/her understanding and acceptance.     Plan Discussed with: CRNA, Anesthesiologist and Surgeon  Anesthesia Plan Comments:         Anesthesia Quick Evaluation

## 2010-12-11 NOTE — Transfer of Care (Signed)
Immediate Anesthesia Transfer of Care Note  Patient: April Guzman  Procedure(s) Performed:  SUPRAVENTRICULAR TACHYCARDIA ABLATION  Patient Location: PACU  Anesthesia Type: MAC  Level of Consciousness: awake, alert , oriented and patient cooperative  Airway & Oxygen Therapy: Patient Spontanous Breathing  Post-op Assessment: Report given to PACU RN, Post -op Vital signs reviewed and stable and Patient moving all extremities  Post vital signs: Reviewed and stable  Complications: No apparent anesthesia complications

## 2010-12-11 NOTE — Op Note (Signed)
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 [id number].

## 2010-12-11 NOTE — Anesthesia Procedure Notes (Signed)
Procedure Name: MAC Date/Time: 12/11/2010 11:32 AM Performed by: Einar Crow Pre-anesthesia Checklist: Patient identified, Emergency Drugs available, Suction available and Patient being monitored Patient Re-evaluated:Patient Re-evaluated prior to inductionOxygen Delivery Method: Nasal Cannula

## 2010-12-17 ENCOUNTER — Encounter: Payer: Self-pay | Admitting: Internal Medicine

## 2010-12-17 ENCOUNTER — Ambulatory Visit (INDEPENDENT_AMBULATORY_CARE_PROVIDER_SITE_OTHER): Payer: Medicare Other | Admitting: Internal Medicine

## 2010-12-17 VITALS — BP 124/78 | HR 66 | Ht 65.0 in | Wt 174.2 lb

## 2010-12-17 DIAGNOSIS — E875 Hyperkalemia: Secondary | ICD-10-CM

## 2010-12-17 DIAGNOSIS — I498 Other specified cardiac arrhythmias: Secondary | ICD-10-CM

## 2010-12-17 DIAGNOSIS — E872 Acidosis, unspecified: Secondary | ICD-10-CM | POA: Insufficient documentation

## 2010-12-17 DIAGNOSIS — R221 Localized swelling, mass and lump, neck: Secondary | ICD-10-CM

## 2010-12-17 DIAGNOSIS — R220 Localized swelling, mass and lump, head: Secondary | ICD-10-CM

## 2010-12-17 DIAGNOSIS — I471 Supraventricular tachycardia: Secondary | ICD-10-CM

## 2010-12-17 NOTE — Assessment & Plan Note (Signed)
Status post RFCA.  Without clinical recurrences. Hopefully this is done. She is off medication

## 2010-12-17 NOTE — Assessment & Plan Note (Signed)
She has fullness following IJ puncture. There is swelling of the sternocleidomastoid. The issue would be airway compression. We'll obtain a CAT scan. Otherwise we'll just follow it and see her she doesn't next week or so

## 2010-12-17 NOTE — Assessment & Plan Note (Signed)
As above.

## 2010-12-17 NOTE — Assessment & Plan Note (Addendum)
Repeat B. Met if still abnormal will refer

## 2010-12-17 NOTE — Progress Notes (Signed)
  HPI  April Guzman is a 35 y.o. female Seen  following catheter ablation for SVT last week done by Dr. Ladona Ridgel in my absence.that went well and she had no recurrent tachycardia palpitations.  She does however complain of discomfort in her right neck with pain radiating up to the top of her head and a warmth. She has had no respiratory difficulties or difficulty swallowing.  Her preoperative blood work was notable for hyperkalemia-5.4 and mild acidemia with a bicarbonate of 18  Past Medical History  Diagnosis Date  . Generalized anxiety disorder   . Vitamin D deficiency   . 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 Hypertension   . Heart murmur   . Hypoglycemia 12/11/10    "I've changed my diet to deal w/it and it works"  . Anemia   . Headache     Past Surgical History  Procedure Date  . Cardiac electrophysiology mapping and ablation 12/11/10  . Cesarean section 2004; 2008    Current Outpatient Prescriptions  Medication Sig Dispense Refill  . acetaminophen (TYLENOL) 325 MG tablet Take 650 mg by mouth every 6 (six) hours as needed. For headache and pain       . citalopram (CELEXA) 20 MG tablet Take 20 mg by mouth daily.         Allergies  Allergen Reactions  . Latex Itching  . Macrobid (Nitrofurantoin Monohydrate Macrocrystals) Nausea Only and Rash    Review of Systems negative except from HPI and PMH  Physical Exam Well developed and well nourished in no acute distress HENT normal E scleral and icterus clear Neck Supple but there is fullness and tenderness in the right neck and swelling of the sternocleidomastoid.  there is no stridor and no evidence of swelling inside the oropharynx JVP flat; carotids brisk and full Clear to ausculation Regular rate and rhythm, no murmurs gallops or rub Soft with active bowel sounds No clubbing cyanosis none Edema Alert and oriented, grossly normal motor and sensory function Skin Warm and Dry  ECG  demonstrates sinus rhythm at 66 Intervals 0.13/09/0.40 Assessment and  Plan

## 2010-12-17 NOTE — Patient Instructions (Signed)
CT scan of neck (with contrast) 12/18/10 @ 1:30 at the [facility] location. Arrive at 1:15 pm.  Nothing to eat or drink 4 hrs prior. Bring med list.   We will call with results of CT. Call the office next week to let us know how you are feeling.

## 2010-12-18 ENCOUNTER — Ambulatory Visit: Payer: Self-pay | Admitting: Internal Medicine

## 2010-12-18 LAB — BASIC METABOLIC PANEL
BUN/Creatinine Ratio: 23 — ABNORMAL HIGH (ref 8–20)
BUN: 14 mg/dL (ref 6–20)
CO2: 21 mmol/L (ref 20–32)
Creatinine, Ser: 0.6 mg/dL (ref 0.57–1.00)
GFR calc Af Amer: 135 mL/min/{1.73_m2} (ref >59)

## 2010-12-23 ENCOUNTER — Telehealth: Payer: Self-pay | Admitting: *Deleted

## 2010-12-23 MED ORDER — METOPROLOL TARTRATE 25 MG PO TABS: 25.0000 mg | ORAL_TABLET | Freq: Two times a day (BID) | ORAL | Status: DC | PRN

## 2010-12-23 NOTE — Telephone Encounter (Signed)
Pt called this AM stating yesterday is first episode since ablation that she felt "heart fluttering." Progressed in frequency and intensity throughout the day. (Pt had SVT ablation 2 weeks prior with [facility], had f/u in office 12/5 with [facility] and had no c/o at that time, doing well, EKG showed NSR HR 66). Pt was "doing things around the house, minimal exertion when these occurred." One time she was at [facility] and this was most intense, heart fluttering and "a lot of chest pain and felt like about to pass out." Symptoms same as before ablation EXCEPT she had never had the chest pain. Pt states unsure if this is r/t anxiety. Pt was never able to check BP or HR, she thinks by palpating HR never got over 80.   After discussing with Dr. Mariah Milling in office, advised pt to monitor symptoms, need to determine if this is one time episode or if recurrent after ablation. If recurrent, will need to monitor again to capture rhythm. Pt does not take any cardiac meds at this time but does have metoprolol succ on hand. We suggested metop tart 25 1-2 PRN for recurrent episodes, she would like to try this. Rx sent to pharmacy, pt will call back for continued problems. She will keep log of BP/HR during episodes and will try to get EKG if possible either in clinic or fire dept as she did previously before ablation.

## 2011-03-10 DIAGNOSIS — F411 Generalized anxiety disorder: Secondary | ICD-10-CM | POA: Diagnosis not present

## 2011-03-10 DIAGNOSIS — M549 Dorsalgia, unspecified: Secondary | ICD-10-CM | POA: Diagnosis not present

## 2011-03-10 DIAGNOSIS — I471 Supraventricular tachycardia: Secondary | ICD-10-CM | POA: Diagnosis not present

## 2011-03-26 ENCOUNTER — Telehealth: Payer: Self-pay | Admitting: *Deleted

## 2011-03-26 ENCOUNTER — Telehealth: Payer: Self-pay | Admitting: Internal Medicine

## 2011-03-26 NOTE — Telephone Encounter (Signed)
Jodette RN spoke with staff and advised patient needs to go to ED

## 2011-03-26 NOTE — Telephone Encounter (Signed)
Pt in office having SVT

## 2011-03-26 NOTE — Telephone Encounter (Signed)
Office staff called stating pt not feeling well, c/o weakness and has hx svt and feels that is what is happening. There is no Dr in their office. Told them to call 911, staff member agreed to plan.

## 2011-05-05 DIAGNOSIS — M549 Dorsalgia, unspecified: Secondary | ICD-10-CM | POA: Diagnosis not present

## 2011-05-05 DIAGNOSIS — I471 Supraventricular tachycardia: Secondary | ICD-10-CM | POA: Diagnosis not present

## 2011-05-05 DIAGNOSIS — E785 Hyperlipidemia, unspecified: Secondary | ICD-10-CM | POA: Diagnosis not present

## 2011-05-05 DIAGNOSIS — R002 Palpitations: Secondary | ICD-10-CM | POA: Diagnosis not present

## 2011-05-05 DIAGNOSIS — E7800 Pure hypercholesterolemia, unspecified: Secondary | ICD-10-CM | POA: Diagnosis not present

## 2011-05-05 DIAGNOSIS — E559 Vitamin D deficiency, unspecified: Secondary | ICD-10-CM | POA: Diagnosis not present

## 2011-05-05 DIAGNOSIS — D649 Anemia, unspecified: Secondary | ICD-10-CM | POA: Diagnosis not present

## 2011-07-24 DIAGNOSIS — R5381 Other malaise: Secondary | ICD-10-CM | POA: Diagnosis not present

## 2011-07-24 DIAGNOSIS — R5383 Other fatigue: Secondary | ICD-10-CM | POA: Diagnosis not present

## 2011-07-24 DIAGNOSIS — F411 Generalized anxiety disorder: Secondary | ICD-10-CM | POA: Diagnosis not present

## 2011-07-24 DIAGNOSIS — E663 Overweight: Secondary | ICD-10-CM | POA: Diagnosis not present

## 2011-07-24 DIAGNOSIS — I471 Supraventricular tachycardia: Secondary | ICD-10-CM | POA: Diagnosis not present

## 2011-09-01 DIAGNOSIS — F411 Generalized anxiety disorder: Secondary | ICD-10-CM | POA: Diagnosis not present

## 2011-09-01 DIAGNOSIS — I471 Supraventricular tachycardia: Secondary | ICD-10-CM | POA: Diagnosis not present

## 2011-09-01 DIAGNOSIS — E669 Obesity, unspecified: Secondary | ICD-10-CM | POA: Diagnosis not present

## 2011-09-29 DIAGNOSIS — M549 Dorsalgia, unspecified: Secondary | ICD-10-CM | POA: Diagnosis not present

## 2011-09-29 DIAGNOSIS — E663 Overweight: Secondary | ICD-10-CM | POA: Diagnosis not present

## 2011-09-29 DIAGNOSIS — I471 Supraventricular tachycardia: Secondary | ICD-10-CM | POA: Diagnosis not present

## 2011-10-30 DIAGNOSIS — M549 Dorsalgia, unspecified: Secondary | ICD-10-CM | POA: Diagnosis not present

## 2011-10-30 DIAGNOSIS — I129 Hypertensive chronic kidney disease with stage 1 through stage 4 chronic kidney disease, or unspecified chronic kidney disease: Secondary | ICD-10-CM | POA: Diagnosis not present

## 2011-10-30 DIAGNOSIS — E663 Overweight: Secondary | ICD-10-CM | POA: Diagnosis not present

## 2011-10-30 DIAGNOSIS — I471 Supraventricular tachycardia: Secondary | ICD-10-CM | POA: Diagnosis not present

## 2011-11-24 DIAGNOSIS — F329 Major depressive disorder, single episode, unspecified: Secondary | ICD-10-CM | POA: Diagnosis not present

## 2011-11-24 DIAGNOSIS — R002 Palpitations: Secondary | ICD-10-CM | POA: Diagnosis not present

## 2011-11-24 DIAGNOSIS — E663 Overweight: Secondary | ICD-10-CM | POA: Diagnosis not present

## 2011-12-22 DIAGNOSIS — F411 Generalized anxiety disorder: Secondary | ICD-10-CM | POA: Diagnosis not present

## 2011-12-22 DIAGNOSIS — E663 Overweight: Secondary | ICD-10-CM | POA: Diagnosis not present

## 2012-01-19 DIAGNOSIS — E663 Overweight: Secondary | ICD-10-CM | POA: Diagnosis not present

## 2012-01-19 DIAGNOSIS — R5381 Other malaise: Secondary | ICD-10-CM | POA: Diagnosis not present

## 2012-01-19 DIAGNOSIS — R5383 Other fatigue: Secondary | ICD-10-CM | POA: Diagnosis not present

## 2012-01-19 DIAGNOSIS — H6590 Unspecified nonsuppurative otitis media, unspecified ear: Secondary | ICD-10-CM | POA: Diagnosis not present

## 2012-03-22 DIAGNOSIS — F329 Major depressive disorder, single episode, unspecified: Secondary | ICD-10-CM | POA: Diagnosis not present

## 2012-03-22 DIAGNOSIS — I471 Supraventricular tachycardia: Secondary | ICD-10-CM | POA: Diagnosis not present

## 2012-03-22 DIAGNOSIS — E669 Obesity, unspecified: Secondary | ICD-10-CM | POA: Diagnosis not present

## 2012-05-20 DIAGNOSIS — W64XXXA Exposure to other animate mechanical forces, initial encounter: Secondary | ICD-10-CM | POA: Diagnosis not present

## 2012-05-20 DIAGNOSIS — F329 Major depressive disorder, single episode, unspecified: Secondary | ICD-10-CM | POA: Diagnosis not present

## 2012-05-20 DIAGNOSIS — T07XXXA Unspecified multiple injuries, initial encounter: Secondary | ICD-10-CM | POA: Diagnosis not present

## 2012-05-25 DIAGNOSIS — T07XXXA Unspecified multiple injuries, initial encounter: Secondary | ICD-10-CM | POA: Diagnosis not present

## 2012-05-25 DIAGNOSIS — I471 Supraventricular tachycardia: Secondary | ICD-10-CM | POA: Diagnosis not present

## 2012-05-25 DIAGNOSIS — W64XXXA Exposure to other animate mechanical forces, initial encounter: Secondary | ICD-10-CM | POA: Diagnosis not present

## 2012-06-08 DIAGNOSIS — IMO0002 Reserved for concepts with insufficient information to code with codable children: Secondary | ICD-10-CM | POA: Diagnosis not present

## 2012-06-08 DIAGNOSIS — W64XXXA Exposure to other animate mechanical forces, initial encounter: Secondary | ICD-10-CM | POA: Diagnosis not present

## 2012-06-24 ENCOUNTER — Emergency Department: Payer: Self-pay | Admitting: Emergency Medicine

## 2012-06-24 DIAGNOSIS — R52 Pain, unspecified: Secondary | ICD-10-CM | POA: Diagnosis not present

## 2012-06-24 DIAGNOSIS — R079 Chest pain, unspecified: Secondary | ICD-10-CM | POA: Diagnosis not present

## 2012-06-24 DIAGNOSIS — I10 Essential (primary) hypertension: Secondary | ICD-10-CM | POA: Diagnosis not present

## 2012-06-24 LAB — TROPONIN I
Troponin-I: <0.02 ng/mL
Troponin-I: <0.02 ng/mL

## 2012-06-24 LAB — BASIC METABOLIC PANEL
Anion Gap: 7 (ref 7–16)
BUN: 14 mg/dL (ref 7–18)
Chloride: 106 mmol/L (ref 98–107)
Co2: 26 mmol/L (ref 21–32)
Creatinine: 0.64 mg/dL (ref 0.60–1.30)
EGFR (African American): >60
EGFR (Non-African Amer.): >60

## 2012-06-24 LAB — HEPATIC FUNCTION PANEL A (ARMC)
Bilirubin,Total: 0.3 mg/dL (ref 0.2–1.0)
Total Protein: 7.8 g/dL (ref 6.4–8.2)

## 2012-06-24 LAB — CBC
MCHC: 32.2 g/dL (ref 32.0–36.0)
Platelet: 305 10*3/uL (ref 150–440)
RBC: 4.52 10*6/uL (ref 3.80–5.20)
RDW: 15.5 % — ABNORMAL HIGH (ref 11.5–14.5)

## 2012-06-24 LAB — CK TOTAL AND CKMB (NOT AT ARMC): CK, Total: 46 U/L (ref 21–215)

## 2012-07-05 DIAGNOSIS — I471 Supraventricular tachycardia: Secondary | ICD-10-CM | POA: Diagnosis not present

## 2012-07-07 DIAGNOSIS — I471 Supraventricular tachycardia: Secondary | ICD-10-CM | POA: Diagnosis not present

## 2012-07-07 DIAGNOSIS — R209 Unspecified disturbances of skin sensation: Secondary | ICD-10-CM | POA: Diagnosis not present

## 2012-07-07 DIAGNOSIS — R5381 Other malaise: Secondary | ICD-10-CM | POA: Diagnosis not present

## 2012-07-07 DIAGNOSIS — R5383 Other fatigue: Secondary | ICD-10-CM | POA: Diagnosis not present

## 2012-07-07 DIAGNOSIS — R002 Palpitations: Secondary | ICD-10-CM | POA: Diagnosis not present

## 2012-07-19 DIAGNOSIS — I471 Supraventricular tachycardia: Secondary | ICD-10-CM | POA: Diagnosis not present

## 2012-10-10 DIAGNOSIS — Z789 Other specified health status: Secondary | ICD-10-CM | POA: Diagnosis not present

## 2012-10-10 DIAGNOSIS — F329 Major depressive disorder, single episode, unspecified: Secondary | ICD-10-CM | POA: Diagnosis not present

## 2012-10-10 DIAGNOSIS — J40 Bronchitis, not specified as acute or chronic: Secondary | ICD-10-CM | POA: Diagnosis not present

## 2012-10-26 DIAGNOSIS — N181 Chronic kidney disease, stage 1: Secondary | ICD-10-CM | POA: Diagnosis not present

## 2012-10-26 DIAGNOSIS — J40 Bronchitis, not specified as acute or chronic: Secondary | ICD-10-CM | POA: Diagnosis not present

## 2012-10-26 DIAGNOSIS — R197 Diarrhea, unspecified: Secondary | ICD-10-CM | POA: Diagnosis not present

## 2012-10-26 DIAGNOSIS — I129 Hypertensive chronic kidney disease with stage 1 through stage 4 chronic kidney disease, or unspecified chronic kidney disease: Secondary | ICD-10-CM | POA: Diagnosis not present

## 2012-11-02 DIAGNOSIS — Z789 Other specified health status: Secondary | ICD-10-CM | POA: Diagnosis not present

## 2012-11-02 DIAGNOSIS — J40 Bronchitis, not specified as acute or chronic: Secondary | ICD-10-CM | POA: Diagnosis not present

## 2012-11-02 DIAGNOSIS — N181 Chronic kidney disease, stage 1: Secondary | ICD-10-CM | POA: Diagnosis not present

## 2012-11-02 DIAGNOSIS — I129 Hypertensive chronic kidney disease with stage 1 through stage 4 chronic kidney disease, or unspecified chronic kidney disease: Secondary | ICD-10-CM | POA: Diagnosis not present

## 2012-11-09 DIAGNOSIS — I129 Hypertensive chronic kidney disease with stage 1 through stage 4 chronic kidney disease, or unspecified chronic kidney disease: Secondary | ICD-10-CM | POA: Diagnosis not present

## 2012-11-09 DIAGNOSIS — N181 Chronic kidney disease, stage 1: Secondary | ICD-10-CM | POA: Diagnosis not present

## 2012-11-09 DIAGNOSIS — E669 Obesity, unspecified: Secondary | ICD-10-CM | POA: Diagnosis not present

## 2012-11-09 DIAGNOSIS — Z789 Other specified health status: Secondary | ICD-10-CM | POA: Diagnosis not present

## 2012-12-07 DIAGNOSIS — I129 Hypertensive chronic kidney disease with stage 1 through stage 4 chronic kidney disease, or unspecified chronic kidney disease: Secondary | ICD-10-CM | POA: Diagnosis not present

## 2012-12-07 DIAGNOSIS — N181 Chronic kidney disease, stage 1: Secondary | ICD-10-CM | POA: Diagnosis not present

## 2012-12-07 DIAGNOSIS — E669 Obesity, unspecified: Secondary | ICD-10-CM | POA: Diagnosis not present

## 2012-12-07 DIAGNOSIS — R002 Palpitations: Secondary | ICD-10-CM | POA: Diagnosis not present

## 2013-01-16 DIAGNOSIS — R350 Frequency of micturition: Secondary | ICD-10-CM | POA: Diagnosis not present

## 2013-01-16 DIAGNOSIS — E669 Obesity, unspecified: Secondary | ICD-10-CM | POA: Diagnosis not present

## 2013-01-16 DIAGNOSIS — Z789 Other specified health status: Secondary | ICD-10-CM | POA: Diagnosis not present

## 2013-01-16 DIAGNOSIS — T148XXA Other injury of unspecified body region, initial encounter: Secondary | ICD-10-CM | POA: Diagnosis not present

## 2013-02-16 DIAGNOSIS — N181 Chronic kidney disease, stage 1: Secondary | ICD-10-CM | POA: Diagnosis not present

## 2013-02-16 DIAGNOSIS — E669 Obesity, unspecified: Secondary | ICD-10-CM | POA: Diagnosis not present

## 2013-02-16 DIAGNOSIS — I129 Hypertensive chronic kidney disease with stage 1 through stage 4 chronic kidney disease, or unspecified chronic kidney disease: Secondary | ICD-10-CM | POA: Diagnosis not present

## 2013-02-16 DIAGNOSIS — F411 Generalized anxiety disorder: Secondary | ICD-10-CM | POA: Diagnosis not present

## 2013-07-26 DIAGNOSIS — I10 Essential (primary) hypertension: Secondary | ICD-10-CM | POA: Diagnosis not present

## 2013-08-09 DIAGNOSIS — D500 Iron deficiency anemia secondary to blood loss (chronic): Secondary | ICD-10-CM | POA: Diagnosis not present

## 2013-08-09 DIAGNOSIS — F411 Generalized anxiety disorder: Secondary | ICD-10-CM | POA: Diagnosis not present

## 2013-08-09 DIAGNOSIS — R079 Chest pain, unspecified: Secondary | ICD-10-CM | POA: Diagnosis not present

## 2013-08-09 DIAGNOSIS — I471 Supraventricular tachycardia: Secondary | ICD-10-CM | POA: Diagnosis not present

## 2013-12-10 IMAGING — CR DG CHEST 2V
1 series · 2 of 2 positions shown · non-contrast
Comparison: none

REASON FOR EXAM: Chest Pain
COMMENTS:

[Series 3: w chest pa · 0.14mm/px · 2 of 2 slices shown]
[im 1/2]
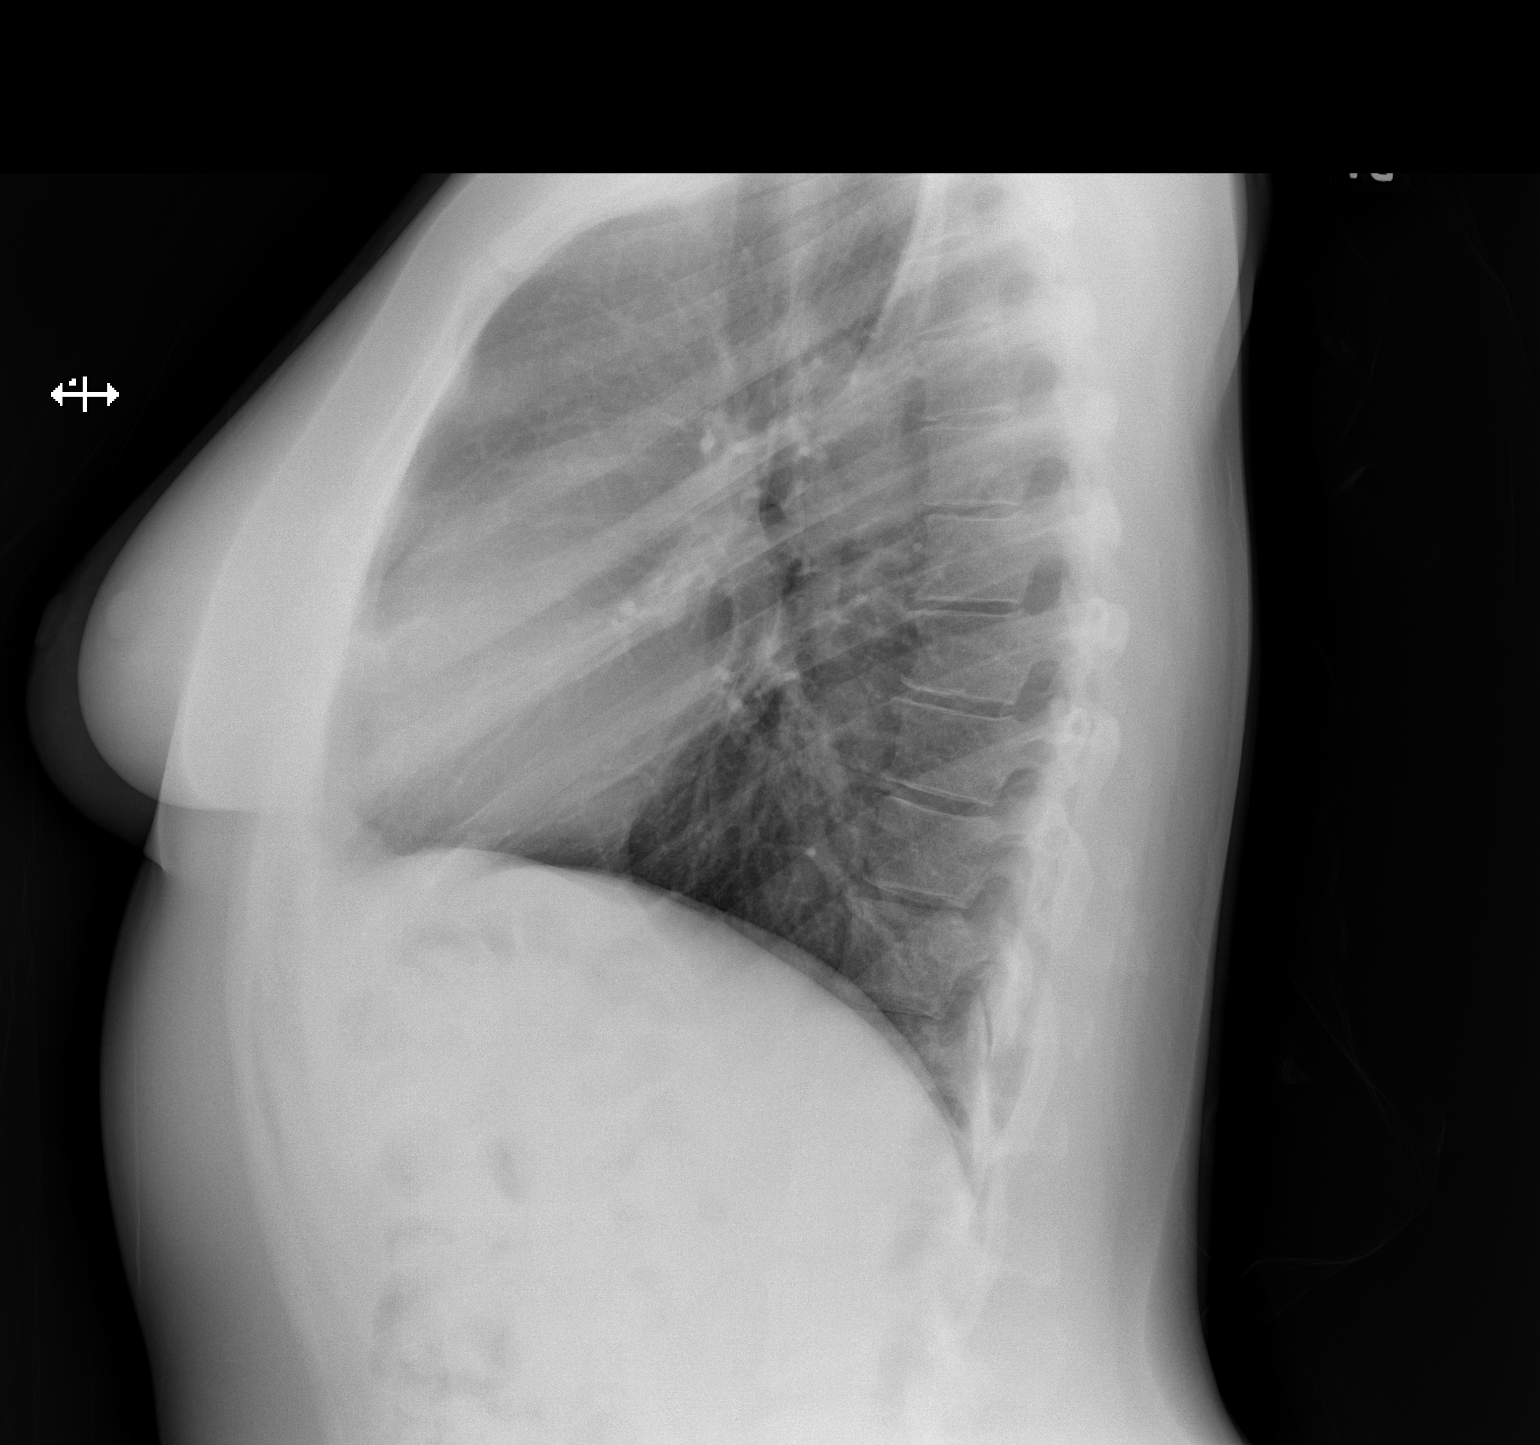
[im 2/2]
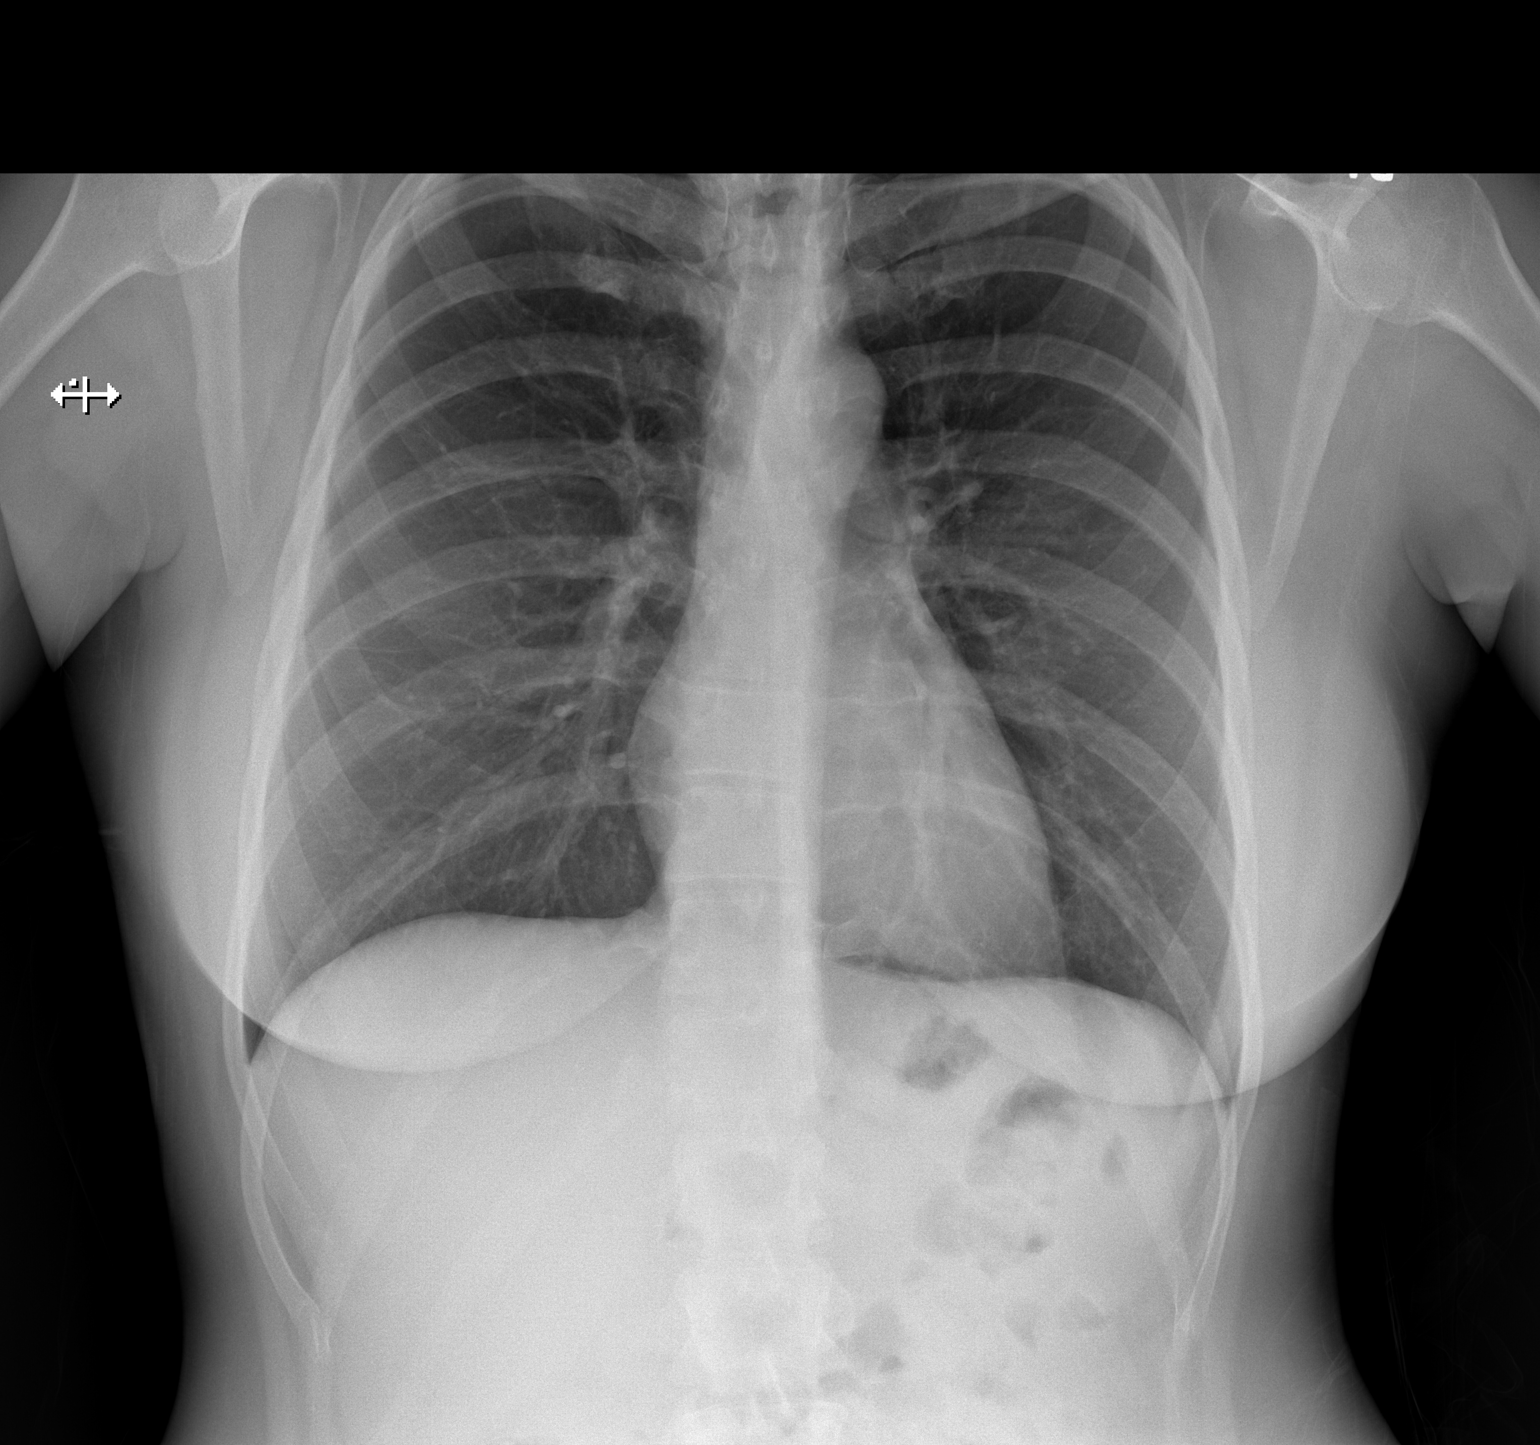

[2 of 2 positions shown; findings below may reference images not displayed]

PROCEDURE:     DXR - DXR CHEST PA (OR AP) AND LATERAL  - June 24, 2012  [DATE]

RESULT:     The lungs are well-expanded and clear. The cardiac silhouette is
normal in size. The mediastinum is normal in width. There is no pulmonary
vascular congestion. There is no pleural effusion or pneumothorax. The bony
thorax is normal in appearance.
IMPRESSION: There is no evidence of pneumonia nor pulmonary edema nor
other acute cardiopulmonary abnormality.

[REDACTED]

## 2013-12-20 ENCOUNTER — Encounter (HOSPITAL_COMMUNITY): Payer: Self-pay | Admitting: Internal Medicine

## 2014-02-12 DIAGNOSIS — R002 Palpitations: Secondary | ICD-10-CM | POA: Diagnosis not present

## 2014-02-12 DIAGNOSIS — I471 Supraventricular tachycardia: Secondary | ICD-10-CM | POA: Diagnosis not present

## 2014-02-12 DIAGNOSIS — D509 Iron deficiency anemia, unspecified: Secondary | ICD-10-CM | POA: Diagnosis not present

## 2014-02-12 DIAGNOSIS — E559 Vitamin D deficiency, unspecified: Secondary | ICD-10-CM | POA: Diagnosis not present

## 2014-03-19 DIAGNOSIS — B0223 Postherpetic polyneuropathy: Secondary | ICD-10-CM | POA: Diagnosis not present

## 2014-03-19 DIAGNOSIS — I471 Supraventricular tachycardia: Secondary | ICD-10-CM | POA: Diagnosis not present

## 2014-03-19 DIAGNOSIS — B028 Zoster with other complications: Secondary | ICD-10-CM | POA: Diagnosis not present

## 2014-03-29 DIAGNOSIS — R42 Dizziness and giddiness: Secondary | ICD-10-CM | POA: Diagnosis not present

## 2014-03-29 DIAGNOSIS — R5381 Other malaise: Secondary | ICD-10-CM | POA: Diagnosis not present

## 2014-03-29 DIAGNOSIS — I498 Other specified cardiac arrhythmias: Secondary | ICD-10-CM | POA: Diagnosis not present

## 2014-03-29 DIAGNOSIS — B0223 Postherpetic polyneuropathy: Secondary | ICD-10-CM | POA: Diagnosis not present

## 2014-03-29 DIAGNOSIS — I10 Essential (primary) hypertension: Secondary | ICD-10-CM | POA: Diagnosis not present

## 2014-04-05 DIAGNOSIS — I471 Supraventricular tachycardia: Secondary | ICD-10-CM | POA: Diagnosis not present

## 2014-04-05 DIAGNOSIS — R002 Palpitations: Secondary | ICD-10-CM | POA: Diagnosis not present

## 2014-04-10 DIAGNOSIS — I471 Supraventricular tachycardia: Secondary | ICD-10-CM | POA: Diagnosis not present

## 2014-04-10 DIAGNOSIS — R002 Palpitations: Secondary | ICD-10-CM | POA: Diagnosis not present

## 2015-04-22 DIAGNOSIS — E663 Overweight: Secondary | ICD-10-CM | POA: Diagnosis not present

## 2015-04-22 DIAGNOSIS — R002 Palpitations: Secondary | ICD-10-CM | POA: Diagnosis not present

## 2015-04-22 DIAGNOSIS — F411 Generalized anxiety disorder: Secondary | ICD-10-CM | POA: Diagnosis not present

## 2015-04-23 ENCOUNTER — Other Ambulatory Visit: Payer: Self-pay | Admitting: Internal Medicine

## 2015-04-23 DIAGNOSIS — R1310 Dysphagia, unspecified: Secondary | ICD-10-CM

## 2015-04-24 DIAGNOSIS — R5381 Other malaise: Secondary | ICD-10-CM | POA: Diagnosis not present

## 2015-04-24 DIAGNOSIS — E784 Other hyperlipidemia: Secondary | ICD-10-CM | POA: Diagnosis not present

## 2015-04-24 DIAGNOSIS — I10 Essential (primary) hypertension: Secondary | ICD-10-CM | POA: Diagnosis not present

## 2015-04-29 ENCOUNTER — Ambulatory Visit: Admission: RE | Admit: 2015-04-29 | Payer: Medicare Other | Source: Ambulatory Visit

## 2015-04-29 ENCOUNTER — Ambulatory Visit: Payer: Medicare Other | Attending: Internal Medicine

## 2015-05-10 DIAGNOSIS — F411 Generalized anxiety disorder: Secondary | ICD-10-CM | POA: Diagnosis not present

## 2015-05-10 DIAGNOSIS — I471 Supraventricular tachycardia: Secondary | ICD-10-CM | POA: Diagnosis not present

## 2015-05-10 DIAGNOSIS — E559 Vitamin D deficiency, unspecified: Secondary | ICD-10-CM | POA: Diagnosis not present

## 2015-09-09 ENCOUNTER — Other Ambulatory Visit: Payer: Self-pay | Admitting: Internal Medicine

## 2015-09-09 DIAGNOSIS — R51 Headache: Secondary | ICD-10-CM | POA: Diagnosis not present

## 2015-09-09 DIAGNOSIS — M2631 Crowding of fully erupted teeth: Secondary | ICD-10-CM | POA: Diagnosis not present

## 2015-09-09 DIAGNOSIS — R1310 Dysphagia, unspecified: Secondary | ICD-10-CM

## 2015-09-09 DIAGNOSIS — E559 Vitamin D deficiency, unspecified: Secondary | ICD-10-CM | POA: Diagnosis not present

## 2015-09-09 DIAGNOSIS — I471 Supraventricular tachycardia: Secondary | ICD-10-CM | POA: Diagnosis not present

## 2015-09-17 ENCOUNTER — Ambulatory Visit: Payer: Medicare Other | Attending: Internal Medicine

## 2015-09-17 ENCOUNTER — Inpatient Hospital Stay: Admission: RE | Admit: 2015-09-17 | Payer: Medicare Other | Source: Ambulatory Visit

## 2016-01-20 DIAGNOSIS — I471 Supraventricular tachycardia: Secondary | ICD-10-CM | POA: Diagnosis not present

## 2016-01-20 DIAGNOSIS — R1013 Epigastric pain: Secondary | ICD-10-CM | POA: Diagnosis not present

## 2016-01-20 DIAGNOSIS — R1110 Vomiting, unspecified: Secondary | ICD-10-CM | POA: Diagnosis not present

## 2016-01-20 DIAGNOSIS — I10 Essential (primary) hypertension: Secondary | ICD-10-CM | POA: Diagnosis not present

## 2016-01-20 DIAGNOSIS — R0602 Shortness of breath: Secondary | ICD-10-CM | POA: Diagnosis not present

## 2016-01-20 DIAGNOSIS — Z885 Allergy status to narcotic agent status: Secondary | ICD-10-CM | POA: Diagnosis not present

## 2016-01-20 DIAGNOSIS — M549 Dorsalgia, unspecified: Secondary | ICD-10-CM | POA: Diagnosis not present

## 2016-01-20 DIAGNOSIS — Z881 Allergy status to other antibiotic agents status: Secondary | ICD-10-CM | POA: Diagnosis not present

## 2016-04-15 DIAGNOSIS — Z1231 Encounter for screening mammogram for malignant neoplasm of breast: Secondary | ICD-10-CM | POA: Diagnosis not present

## 2016-04-15 DIAGNOSIS — Z1321 Encounter for screening for nutritional disorder: Secondary | ICD-10-CM | POA: Diagnosis not present

## 2016-04-15 DIAGNOSIS — N920 Excessive and frequent menstruation with regular cycle: Secondary | ICD-10-CM | POA: Diagnosis not present

## 2016-04-15 DIAGNOSIS — Z01419 Encounter for gynecological examination (general) (routine) without abnormal findings: Secondary | ICD-10-CM | POA: Diagnosis not present

## 2016-04-15 DIAGNOSIS — E559 Vitamin D deficiency, unspecified: Secondary | ICD-10-CM | POA: Diagnosis not present

## 2016-04-15 DIAGNOSIS — R300 Dysuria: Secondary | ICD-10-CM | POA: Diagnosis not present

## 2016-04-15 DIAGNOSIS — Z0000 Encounter for general adult medical examination without abnormal findings: Secondary | ICD-10-CM | POA: Diagnosis not present

## 2016-04-15 DIAGNOSIS — R5383 Other fatigue: Secondary | ICD-10-CM | POA: Diagnosis not present

## 2016-04-15 DIAGNOSIS — R87615 Unsatisfactory cytologic smear of cervix: Secondary | ICD-10-CM | POA: Diagnosis not present

## 2016-04-15 DIAGNOSIS — Z136 Encounter for screening for cardiovascular disorders: Secondary | ICD-10-CM | POA: Diagnosis not present

## 2016-04-17 ENCOUNTER — Other Ambulatory Visit: Payer: Self-pay | Admitting: Obstetrics & Gynecology

## 2016-04-17 DIAGNOSIS — Z1239 Encounter for other screening for malignant neoplasm of breast: Secondary | ICD-10-CM

## 2016-04-28 ENCOUNTER — Ambulatory Visit
Admission: RE | Admit: 2016-04-28 | Discharge: 2016-04-28 | Disposition: A | Discharge status: Home / Self Care | Payer: Medicare Other | Source: Ambulatory Visit | Attending: Obstetrics & Gynecology | Admitting: Obstetrics & Gynecology

## 2016-04-28 DIAGNOSIS — R5381 Other malaise: Secondary | ICD-10-CM | POA: Diagnosis not present

## 2016-04-28 DIAGNOSIS — D508 Other iron deficiency anemias: Secondary | ICD-10-CM | POA: Diagnosis not present

## 2016-04-28 DIAGNOSIS — Z1231 Encounter for screening mammogram for malignant neoplasm of breast: Secondary | ICD-10-CM | POA: Insufficient documentation

## 2016-04-28 DIAGNOSIS — Z1239 Encounter for other screening for malignant neoplasm of breast: Secondary | ICD-10-CM

## 2016-04-28 DIAGNOSIS — D509 Iron deficiency anemia, unspecified: Secondary | ICD-10-CM | POA: Diagnosis not present

## 2016-05-07 ENCOUNTER — Other Ambulatory Visit: Payer: Self-pay

## 2016-05-07 ENCOUNTER — Telehealth: Payer: Self-pay

## 2016-05-07 DIAGNOSIS — K219 Gastro-esophageal reflux disease without esophagitis: Secondary | ICD-10-CM

## 2016-05-07 NOTE — Telephone Encounter (Signed)
Gastroenterology Pre-Procedure Review  Request Date: 5/11 Requesting Physician: Dr. Allen Norris  PATIENT REVIEW QUESTIONS: The patient responded to the following health history questions as indicated:    1. Are you having any GI issues? yes (GERD) 2. Do you have a personal history of Polyps? no 3. Do you have a family history of Colon Cancer or Polyps? yes (f:polyps; pgf & all cousins:ulcerative colitis) 4. Diabetes Mellitus? no 5. Joint replacements in the past 12 months?no 6. Major health problems in the past 3 months?no 7. Any artificial heart valves, MVP, or defibrillator? A-Fib    MEDICATIONS & ALLERGIES:    Patient reports the following regarding taking any anticoagulation/antiplatelet therapy:   Plavix, Coumadin, Eliquis, Xarelto, Lovenox, Pradaxa, Brilinta, or Effient? no Aspirin? no  Patient confirms/reports the following medications:  Current Outpatient Prescriptions  Medication Sig Dispense Refill  . acetaminophen (TYLENOL) 325 MG tablet Take 650 mg by mouth every 6 (six) hours as needed. For headache and pain     . citalopram (CELEXA) 20 MG tablet Take 20 mg by mouth daily.     . metoprolol tartrate (LOPRESSOR) 25 MG tablet Take 1 tablet (25 mg total) by mouth 2 (two) times daily as needed. 60 tablet 3   No current facility-administered medications for this visit.     Patient confirms/reports the following allergies:  Allergies  Allergen Reactions  . Latex Itching  . Macrobid [Nitrofurantoin Monohydrate Macrocrystals] Nausea Only and Rash    No orders of the defined types were placed in this encounter.   AUTHORIZATION INFORMATION Primary Insurance: 1D#: Group #:  Secondary Insurance: 1D#: Group #:  SCHEDULE INFORMATION: Date: 5/11 Time: Location: [place]

## 2016-05-08 ENCOUNTER — Telehealth: Payer: Self-pay | Admitting: Gastroenterology

## 2016-05-08 NOTE — Telephone Encounter (Signed)
05/08/16 Patient has MCR/BCBS NO prior auth required for EGD & Colonoscopy [place] & [phone]

## 2016-05-14 ENCOUNTER — Encounter: Payer: Self-pay | Admitting: Anesthesiology

## 2016-05-19 DIAGNOSIS — Z832 Family history of diseases of the blood and blood-forming organs and certain disorders involving the immune mechanism: Secondary | ICD-10-CM | POA: Diagnosis not present

## 2016-05-19 DIAGNOSIS — N920 Excessive and frequent menstruation with regular cycle: Secondary | ICD-10-CM | POA: Diagnosis not present

## 2016-05-19 DIAGNOSIS — N938 Other specified abnormal uterine and vaginal bleeding: Secondary | ICD-10-CM | POA: Diagnosis not present

## 2016-05-19 DIAGNOSIS — D500 Iron deficiency anemia secondary to blood loss (chronic): Secondary | ICD-10-CM | POA: Diagnosis not present

## 2016-05-21 NOTE — Discharge Instructions (Signed)

## 2016-05-22 ENCOUNTER — Ambulatory Visit: Admission: RE | Admit: 2016-05-22 | Payer: Medicare Other | Source: Ambulatory Visit | Admitting: Gastroenterology

## 2016-05-22 HISTORY — DX: Major depressive disorder, single episode, unspecified: F32.9

## 2016-05-22 HISTORY — DX: Depression, unspecified: F32.A

## 2016-05-22 HISTORY — DX: Cardiac arrhythmia, unspecified: I49.9

## 2016-05-22 HISTORY — DX: Gastro-esophageal reflux disease without esophagitis: K21.9

## 2016-05-22 HISTORY — DX: Spondylolysis, site unspecified: M43.00

## 2016-05-22 SURGERY — COLONOSCOPY WITH PROPOFOL
Anesthesia: Choice

## 2016-07-24 DIAGNOSIS — D500 Iron deficiency anemia secondary to blood loss (chronic): Secondary | ICD-10-CM | POA: Diagnosis not present

## 2016-08-10 DIAGNOSIS — D500 Iron deficiency anemia secondary to blood loss (chronic): Secondary | ICD-10-CM | POA: Diagnosis not present

## 2016-08-10 DIAGNOSIS — N920 Excessive and frequent menstruation with regular cycle: Secondary | ICD-10-CM | POA: Diagnosis not present

## 2017-01-15 DIAGNOSIS — Z23 Encounter for immunization: Secondary | ICD-10-CM | POA: Diagnosis not present

## 2017-01-15 DIAGNOSIS — Z111 Encounter for screening for respiratory tuberculosis: Secondary | ICD-10-CM | POA: Diagnosis not present

## 2017-02-15 DIAGNOSIS — Z23 Encounter for immunization: Secondary | ICD-10-CM | POA: Diagnosis not present

## 2017-06-17 ENCOUNTER — Other Ambulatory Visit: Payer: Self-pay | Admitting: Obstetrics & Gynecology

## 2017-06-17 DIAGNOSIS — Z1231 Encounter for screening mammogram for malignant neoplasm of breast: Secondary | ICD-10-CM

## 2017-08-04 ENCOUNTER — Other Ambulatory Visit: Payer: Self-pay | Admitting: Student

## 2017-08-04 DIAGNOSIS — K219 Gastro-esophageal reflux disease without esophagitis: Secondary | ICD-10-CM

## 2017-08-04 DIAGNOSIS — R1310 Dysphagia, unspecified: Secondary | ICD-10-CM

## 2017-08-06 ENCOUNTER — Ambulatory Visit
Admission: RE | Admit: 2017-08-06 | Discharge: 2017-08-06 | Disposition: A | Discharge status: Home / Self Care | Payer: BLUE CROSS/BLUE SHIELD | Source: Ambulatory Visit | Attending: Student | Admitting: Student

## 2017-08-06 DIAGNOSIS — R1310 Dysphagia, unspecified: Secondary | ICD-10-CM | POA: Diagnosis present

## 2017-08-06 DIAGNOSIS — K219 Gastro-esophageal reflux disease without esophagitis: Secondary | ICD-10-CM | POA: Diagnosis present

## 2017-08-26 ENCOUNTER — Encounter
Admission: RE | Admit: 2017-08-26 | Discharge: 2017-08-26 | Disposition: A | Discharge status: Home / Self Care | Payer: BLUE CROSS/BLUE SHIELD | Source: Ambulatory Visit | Attending: Obstetrics & Gynecology | Admitting: Obstetrics & Gynecology

## 2017-08-26 ENCOUNTER — Other Ambulatory Visit: Payer: Self-pay

## 2017-08-26 DIAGNOSIS — Z01812 Encounter for preprocedural laboratory examination: Secondary | ICD-10-CM | POA: Insufficient documentation

## 2017-08-26 DIAGNOSIS — I10 Essential (primary) hypertension: Secondary | ICD-10-CM | POA: Diagnosis not present

## 2017-08-26 DIAGNOSIS — Z01810 Encounter for preprocedural cardiovascular examination: Secondary | ICD-10-CM | POA: Diagnosis present

## 2017-08-26 HISTORY — DX: Family history of other specified conditions: Z84.89

## 2017-08-26 LAB — BASIC METABOLIC PANEL
ANION GAP: 7 (ref 5–15)
BUN: 14 mg/dL (ref 6–20)
CO2: 22 mmol/L (ref 22–32)
Calcium: 9.1 mg/dL (ref 8.9–10.3)
Chloride: 106 mmol/L (ref 98–111)
Creatinine, Ser: 0.54 mg/dL (ref 0.44–1.00)
GFR calc Af Amer: >60 mL/min (ref >60)
GLUCOSE: 102 mg/dL — AB (ref 70–99)
POTASSIUM: 3.8 mmol/L (ref 3.5–5.1)
Sodium: 135 mmol/L (ref 135–145)

## 2017-08-26 LAB — CBC
HCT: 37.9 % (ref 35.0–47.0)
Hemoglobin: 13.1 g/dL (ref 12.0–16.0)
MCH: 29.2 pg (ref 26.0–34.0)
MCHC: 34.5 g/dL (ref 32.0–36.0)
MCV: 84.8 fL (ref 80.0–100.0)
PLATELETS: 325 10*3/uL (ref 150–440)
RBC: 4.47 MIL/uL (ref 3.80–5.20)
RDW: 14 % (ref 11.5–14.5)
WBC: 5.2 10*3/uL (ref 3.6–11.0)

## 2017-08-26 LAB — TYPE AND SCREEN
ABO/RH(D): O POS
Antibody Screen: NEGATIVE

## 2017-08-26 NOTE — Patient Instructions (Signed)
Your procedure is scheduled QQ:[id number] 8/23 Report to Day Surgery. To find out your arrival time please call [phone] between 1PM - 3PM on Thurs. 8/22.  Remember: Instructions that are not followed completely may result in serious medical risk,  up to and including death, or upon the discretion of your surgeon and anesthesiologist your  surgery may need to be rescheduled.     _X__ 1. Do not eat food after midnight the night before your procedure.                 No gum chewing or hard candies. You may drink clear liquids up to 2 hours                 before you are scheduled to arrive for your surgery- DO not drink clear                 liquids within 2 hours of the start of your surgery.                 Clear Liquids include:  water, apple juice without pulp, clear carbohydrate                 drink such as Clearfast of Gatorade, Black Coffee or Tea (Do not add                 anything to coffee or tea).  __X__2.  On the morning of surgery brush your teeth with toothpaste and water, you                may rinse your mouth with mouthwash if you wish.  Do not swallow any toothpaste of mouthwash.     ___ 3.  No Alcohol for 24 hours before or after surgery.   ___ 4.  Do Not Smoke or use e-cigarettes For 24 Hours Prior to Your Surgery.                 Do not use any chewable tobacco products for at least 6 hours prior to                 surgery.  ____  5.  Bring all medications with you on the day of surgery if instructed.   __x__  6.  Notify your doctor if there is any change in your medical condition      (cold, fever, infections).     Do not wear jewelry, make-up, hairpins, clips or nail polish. Do not wear lotions, powders, or perfumes. You may wear deodorant. Do not shave 48 hours prior to surgery. Men may shave face and neck. Do not bring valuables to the hospital.    [facility] is not responsible for any belongings or valuables.  Contacts, dentures  or bridgework may not be worn into surgery. Leave your suitcase in the car. After surgery it may be brought to your room. For patients admitted to the hospital, discharge time is determined by your treatment team.   Patients discharged the day of surgery will not be allowed to drive home.   Please read over the following fact sheets that you were given:    __x__ Take these medicines the morning of surgery with A SIP OF WATER:    1. metoprolol tartrate (LOPRESSOR) 25 MG tablet  2. omeprazole (PRILOSEC) 20 MG capsule  3.   4.  5.  6.  ____ Fleet Enema (as directed)   __x__ Use CHG Soap as directed  ____ Use inhalers on the day of surgery  ____ Stop metformin 2 days prior to surgery    ____ Take 1/2 of usual insulin dose the night before surgery. No insulin the morning          of surgery.   ____ Stop Coumadin/Plavix/aspirin on   ____ Stop Anti-inflammatories on    __x__ Stop supplements until after surgery.  CRANBERRY PO  ____ Bring C-Pap to the hospital.

## 2017-09-02 MED ORDER — CEFAZOLIN SODIUM-DEXTROSE 2-4 GM/100ML-% IV SOLN
2.0000 g | Freq: Once | INTRAVENOUS | Status: AC
  Administered 2017-09-03: 2 g via INTRAVENOUS

## 2017-09-03 ENCOUNTER — Ambulatory Visit: Payer: BLUE CROSS/BLUE SHIELD | Admitting: Certified Registered"

## 2017-09-03 ENCOUNTER — Encounter
Admission: RE | Disposition: A | Discharge status: Home / Self Care | Payer: Self-pay | Source: Ambulatory Visit | Attending: Obstetrics & Gynecology

## 2017-09-03 ENCOUNTER — Other Ambulatory Visit: Payer: Self-pay

## 2017-09-03 ENCOUNTER — Other Ambulatory Visit: Payer: Self-pay | Admitting: Obstetrics & Gynecology

## 2017-09-03 ENCOUNTER — Encounter: Payer: Self-pay | Admitting: Certified Registered"

## 2017-09-03 ENCOUNTER — Ambulatory Visit
Admission: RE | Admit: 2017-09-03 | Discharge: 2017-09-03 | Disposition: A | Discharge status: Home / Self Care | Payer: BLUE CROSS/BLUE SHIELD | Source: Ambulatory Visit | Attending: Obstetrics & Gynecology | Admitting: Obstetrics & Gynecology

## 2017-09-03 DIAGNOSIS — F411 Generalized anxiety disorder: Secondary | ICD-10-CM | POA: Diagnosis not present

## 2017-09-03 DIAGNOSIS — D251 Intramural leiomyoma of uterus: Secondary | ICD-10-CM | POA: Diagnosis not present

## 2017-09-03 DIAGNOSIS — I10 Essential (primary) hypertension: Secondary | ICD-10-CM | POA: Insufficient documentation

## 2017-09-03 DIAGNOSIS — R102 Pelvic and perineal pain unspecified side: Secondary | ICD-10-CM | POA: Diagnosis present

## 2017-09-03 DIAGNOSIS — N800 Endometriosis of uterus: Secondary | ICD-10-CM | POA: Insufficient documentation

## 2017-09-03 DIAGNOSIS — Z888 Allergy status to other drugs, medicaments and biological substances status: Secondary | ICD-10-CM | POA: Insufficient documentation

## 2017-09-03 DIAGNOSIS — Z885 Allergy status to narcotic agent status: Secondary | ICD-10-CM | POA: Diagnosis not present

## 2017-09-03 DIAGNOSIS — K219 Gastro-esophageal reflux disease without esophagitis: Secondary | ICD-10-CM | POA: Diagnosis not present

## 2017-09-03 DIAGNOSIS — N838 Other noninflammatory disorders of ovary, fallopian tube and broad ligament: Secondary | ICD-10-CM | POA: Insufficient documentation

## 2017-09-03 DIAGNOSIS — R011 Cardiac murmur, unspecified: Secondary | ICD-10-CM | POA: Diagnosis not present

## 2017-09-03 DIAGNOSIS — Z79899 Other long term (current) drug therapy: Secondary | ICD-10-CM | POA: Diagnosis not present

## 2017-09-03 DIAGNOSIS — I341 Nonrheumatic mitral (valve) prolapse: Secondary | ICD-10-CM | POA: Insufficient documentation

## 2017-09-03 DIAGNOSIS — F329 Major depressive disorder, single episode, unspecified: Secondary | ICD-10-CM | POA: Diagnosis not present

## 2017-09-03 DIAGNOSIS — Z91040 Latex allergy status: Secondary | ICD-10-CM | POA: Diagnosis not present

## 2017-09-03 DIAGNOSIS — E559 Vitamin D deficiency, unspecified: Secondary | ICD-10-CM | POA: Insufficient documentation

## 2017-09-03 DIAGNOSIS — F17210 Nicotine dependence, cigarettes, uncomplicated: Secondary | ICD-10-CM | POA: Diagnosis not present

## 2017-09-03 DIAGNOSIS — R109 Unspecified abdominal pain: Secondary | ICD-10-CM

## 2017-09-03 DIAGNOSIS — N920 Excessive and frequent menstruation with regular cycle: Secondary | ICD-10-CM | POA: Diagnosis present

## 2017-09-03 HISTORY — PX: LAPAROSCOPIC BILATERAL SALPINGECTOMY: SHX5889

## 2017-09-03 HISTORY — PX: LAPAROSCOPIC HYSTERECTOMY: SHX1926

## 2017-09-03 LAB — POCT PREGNANCY, URINE: PREG TEST UR: NEGATIVE

## 2017-09-03 LAB — ABO/RH: ABO/RH(D): O POS

## 2017-09-03 SURGERY — HYSTERECTOMY, TOTAL, LAPAROSCOPIC
Anesthesia: General

## 2017-09-03 MED ORDER — FENTANYL CITRATE (PF) 250 MCG/5ML IJ SOLN
INTRAMUSCULAR | Status: AC
  Filled 2017-09-03: qty 5

## 2017-09-03 MED ORDER — IBUPROFEN 800 MG PO TABS: 800.0000 mg | ORAL_TABLET | Freq: Four times a day (QID) | ORAL | 0 refills | Status: AC

## 2017-09-03 MED ORDER — SUGAMMADEX SODIUM 200 MG/2ML IV SOLN
INTRAVENOUS | Status: DC | PRN
  Administered 2017-09-03: 200 mg via INTRAVENOUS

## 2017-09-03 MED ORDER — GABAPENTIN 300 MG PO CAPS
600.0000 mg | ORAL_CAPSULE | Freq: Once | ORAL | Status: AC
Start: 2017-09-03 — End: 2017-09-03
  Administered 2017-09-03: 600 mg via ORAL

## 2017-09-03 MED ORDER — ONDANSETRON HCL 4 MG/2ML IJ SOLN
INTRAMUSCULAR | Status: AC
  Filled 2017-09-03: qty 2

## 2017-09-03 MED ORDER — PROPOFOL 10 MG/ML IV BOLUS
INTRAVENOUS | Status: AC
  Filled 2017-09-03: qty 20

## 2017-09-03 MED ORDER — CEFAZOLIN SODIUM-DEXTROSE 2-4 GM/100ML-% IV SOLN
INTRAVENOUS | Status: AC
  Filled 2017-09-03: qty 100

## 2017-09-03 MED ORDER — HEPARIN SODIUM (PORCINE) 5000 UNIT/ML IJ SOLN
INTRAMUSCULAR | Status: AC
  Filled 2017-09-03: qty 1

## 2017-09-03 MED ORDER — ACETAMINOPHEN 500 MG PO TABS
1000.0000 mg | ORAL_TABLET | Freq: Once | ORAL | Status: AC
  Administered 2017-09-03: 1000 mg via ORAL

## 2017-09-03 MED ORDER — PHENYLEPHRINE HCL 10 MG/ML IJ SOLN
INTRAMUSCULAR | Status: DC | PRN
  Administered 2017-09-03 (×2): 100 ug via INTRAVENOUS
  Administered 2017-09-03 (×2): 50 ug via INTRAVENOUS
  Administered 2017-09-03 (×2): 100 ug via INTRAVENOUS

## 2017-09-03 MED ORDER — GABAPENTIN 300 MG PO CAPS
ORAL_CAPSULE | ORAL | Status: AC
  Filled 2017-09-03: qty 2

## 2017-09-03 MED ORDER — EPHEDRINE SULFATE 50 MG/ML IJ SOLN
INTRAMUSCULAR | Status: DC | PRN
  Administered 2017-09-03 (×2): 10 mg via INTRAVENOUS
  Administered 2017-09-03: 20 mg via INTRAVENOUS
  Administered 2017-09-03: 10 mg via INTRAVENOUS

## 2017-09-03 MED ORDER — LIDOCAINE HCL (CARDIAC) PF 100 MG/5ML IV SOSY
PREFILLED_SYRINGE | INTRAVENOUS | Status: DC | PRN
  Administered 2017-09-03: 50 mg via INTRAVENOUS

## 2017-09-03 MED ORDER — LACTATED RINGERS IV SOLN
INTRAVENOUS | Status: DC
  Administered 2017-09-03 (×2): via INTRAVENOUS

## 2017-09-03 MED ORDER — MIDAZOLAM HCL 2 MG/2ML IJ SOLN
INTRAMUSCULAR | Status: DC | PRN
  Administered 2017-09-03: 2 mg via INTRAVENOUS
  Administered 2017-09-03: 3 mg via INTRAVENOUS

## 2017-09-03 MED ORDER — PHENYLEPHRINE HCL 10 MG/ML IJ SOLN
INTRAMUSCULAR | Status: AC
  Filled 2017-09-03: qty 1

## 2017-09-03 MED ORDER — KETAMINE HCL 10 MG/ML IJ SOLN
INTRAMUSCULAR | Status: DC | PRN
  Administered 2017-09-03 (×2): 25 mg via INTRAVENOUS

## 2017-09-03 MED ORDER — KETOROLAC TROMETHAMINE 30 MG/ML IJ SOLN
INTRAMUSCULAR | Status: DC | PRN
  Administered 2017-09-03: 30 mg via INTRAVENOUS

## 2017-09-03 MED ORDER — EPHEDRINE SULFATE 50 MG/ML IJ SOLN
INTRAMUSCULAR | Status: AC
  Filled 2017-09-03: qty 1

## 2017-09-03 MED ORDER — FENTANYL CITRATE (PF) 100 MCG/2ML IJ SOLN
INTRAMUSCULAR | Status: DC | PRN
  Administered 2017-09-03 (×5): 50 ug via INTRAVENOUS

## 2017-09-03 MED ORDER — ROCURONIUM BROMIDE 100 MG/10ML IV SOLN
INTRAVENOUS | Status: AC
  Filled 2017-09-03: qty 1

## 2017-09-03 MED ORDER — ACETAMINOPHEN 500 MG PO TABS
ORAL_TABLET | ORAL | Status: AC
  Filled 2017-09-03: qty 2

## 2017-09-03 MED ORDER — KETOROLAC TROMETHAMINE 30 MG/ML IJ SOLN: 30.0000 mg | Freq: Four times a day (QID) | INTRAMUSCULAR | Status: DC

## 2017-09-03 MED ORDER — DEXAMETHASONE SODIUM PHOSPHATE 10 MG/ML IJ SOLN
INTRAMUSCULAR | Status: DC | PRN
  Administered 2017-09-03: 10 mg via INTRAVENOUS

## 2017-09-03 MED ORDER — OXYCODONE HCL 5 MG PO TABS
5.0000 mg | ORAL_TABLET | ORAL | Status: DC | PRN
  Administered 2017-09-03: 5 mg via ORAL

## 2017-09-03 MED ORDER — ONDANSETRON HCL 4 MG/2ML IJ SOLN
4.0000 mg | Freq: Once | INTRAMUSCULAR | Status: AC | PRN
  Administered 2017-09-03: 4 mg via INTRAVENOUS

## 2017-09-03 MED ORDER — ROCURONIUM BROMIDE 100 MG/10ML IV SOLN
INTRAVENOUS | Status: DC | PRN
  Administered 2017-09-03: 20 mg via INTRAVENOUS
  Administered 2017-09-03: 50 mg via INTRAVENOUS

## 2017-09-03 MED ORDER — ONDANSETRON 4 MG PO TBDP: 4.0000 mg | ORAL_TABLET | ORAL | 1 refills | Status: DC | PRN

## 2017-09-03 MED ORDER — LACTATED RINGERS IV SOLN
INTRAVENOUS | Status: DC | PRN
  Administered 2017-09-03: 11:00:00 via INTRAVENOUS

## 2017-09-03 MED ORDER — SUGAMMADEX SODIUM 200 MG/2ML IV SOLN
INTRAVENOUS | Status: AC
  Filled 2017-09-03: qty 2

## 2017-09-03 MED ORDER — LIDOCAINE HCL (PF) 2 % IJ SOLN
INTRAMUSCULAR | Status: AC
  Filled 2017-09-03: qty 10

## 2017-09-03 MED ORDER — GLYCOPYRROLATE 0.2 MG/ML IJ SOLN
INTRAMUSCULAR | Status: DC | PRN
  Administered 2017-09-03 (×3): 0.1 mg via INTRAVENOUS

## 2017-09-03 MED ORDER — FUROSEMIDE 10 MG/ML IJ SOLN
20.0000 mg | Freq: Once | INTRAMUSCULAR | Status: AC
  Administered 2017-09-03: 20 mg via INTRAVENOUS

## 2017-09-03 MED ORDER — HEPARIN SODIUM (PORCINE) 5000 UNIT/ML IJ SOLN
5000.0000 [IU] | Freq: Once | INTRAMUSCULAR | Status: AC
  Administered 2017-09-03: 5000 [IU] via SUBCUTANEOUS

## 2017-09-03 MED ORDER — FENTANYL CITRATE (PF) 100 MCG/2ML IJ SOLN
25.0000 ug | INTRAMUSCULAR | Status: DC | PRN
  Administered 2017-09-03 (×2): 25 ug via INTRAVENOUS

## 2017-09-03 MED ORDER — FUROSEMIDE 10 MG/ML IJ SOLN
INTRAMUSCULAR | Status: AC
  Filled 2017-09-03: qty 2

## 2017-09-03 MED ORDER — ACETAMINOPHEN 325 MG PO TABS: 650.0000 mg | ORAL_TABLET | ORAL | Status: DC | PRN

## 2017-09-03 MED ORDER — CELECOXIB 200 MG PO CAPS
ORAL_CAPSULE | ORAL | Status: AC
  Filled 2017-09-03: qty 2

## 2017-09-03 MED ORDER — LACTATED RINGERS IV SOLN: INTRAVENOUS | Status: DC

## 2017-09-03 MED ORDER — CELECOXIB 200 MG PO CAPS
400.0000 mg | ORAL_CAPSULE | Freq: Once | ORAL | Status: AC
  Administered 2017-09-03: 400 mg via ORAL

## 2017-09-03 MED ORDER — ACETAMINOPHEN 650 MG RE SUPP: 650.0000 mg | RECTAL | Status: DC | PRN

## 2017-09-03 MED ORDER — MIDAZOLAM HCL 5 MG/5ML IJ SOLN
INTRAMUSCULAR | Status: AC
  Filled 2017-09-03: qty 5

## 2017-09-03 MED ORDER — OXYCODONE HCL 5 MG PO TABS
ORAL_TABLET | ORAL | Status: AC
  Filled 2017-09-03: qty 1

## 2017-09-03 MED ORDER — PROPOFOL 10 MG/ML IV BOLUS
INTRAVENOUS | Status: DC | PRN
  Administered 2017-09-03: 150 mg via INTRAVENOUS

## 2017-09-03 MED ORDER — SEVOFLURANE IN SOLN
RESPIRATORY_TRACT | Status: AC
  Filled 2017-09-03: qty 250

## 2017-09-03 MED ORDER — GLYCOPYRROLATE 0.2 MG/ML IJ SOLN
INTRAMUSCULAR | Status: AC
  Filled 2017-09-03: qty 1

## 2017-09-03 MED ORDER — DEXAMETHASONE SODIUM PHOSPHATE 10 MG/ML IJ SOLN
INTRAMUSCULAR | Status: AC
  Filled 2017-09-03: qty 1

## 2017-09-03 MED ORDER — FENTANYL CITRATE (PF) 100 MCG/2ML IJ SOLN
INTRAMUSCULAR | Status: AC
  Administered 2017-09-03: 25 ug via INTRAVENOUS
  Filled 2017-09-03: qty 2

## 2017-09-03 MED ORDER — OXYCODONE HCL 5 MG PO TABS: 5.0000 mg | ORAL_TABLET | ORAL | 0 refills | Status: AC | PRN

## 2017-09-03 MED ORDER — ONDANSETRON HCL 4 MG/2ML IJ SOLN
INTRAMUSCULAR | Status: DC | PRN
  Administered 2017-09-03: 4 mg via INTRAVENOUS

## 2017-09-03 SURGICAL SUPPLY — 56 items
BAG URINE DRAINAGE (UROLOGICAL SUPPLIES) ×3 IMPLANT
BLADE SURG SZ11 CARB STEEL (BLADE) ×3 IMPLANT
CANISTER SUCT 1200ML W/VALVE (MISCELLANEOUS) ×3 IMPLANT
CATH FOLEY 2WAY  5CC 16FR (CATHETERS) ×1
CATH URTH 16FR FL 2W BLN LF (CATHETERS) ×2 IMPLANT
CHLORAPREP W/TINT 26ML (MISCELLANEOUS) ×6 IMPLANT
DEFOGGER SCOPE WARMER CLEARIFY (MISCELLANEOUS) ×3 IMPLANT
DERMABOND ADVANCED (GAUZE/BANDAGES/DRESSINGS) ×1
DERMABOND ADVANCED .7 DNX12 (GAUZE/BANDAGES/DRESSINGS) ×2 IMPLANT
DEVICE SUTURE ENDOST 10MM (ENDOMECHANICALS) IMPLANT
DRAPE LEGGINS SURG 28X43 STRL (DRAPES) ×3 IMPLANT
DRAPE SHEET LG 3/4 BI-LAMINATE (DRAPES) ×3 IMPLANT
DRAPE UNDER BUTTOCK W/FLU (DRAPES) ×3 IMPLANT
ELECT REM PT RETURN 9FT ADLT (ELECTROSURGICAL) ×3
ELECTRODE REM PT RTRN 9FT ADLT (ELECTROSURGICAL) ×2 IMPLANT
GLOVE PI ORTHOPRO 6.5 (GLOVE) ×1
GLOVE PI ORTHOPRO STRL 6.5 (GLOVE) ×2 IMPLANT
GLOVE SURG SYN 6.5 ES PF (GLOVE) ×9 IMPLANT
GOWN STRL REUS W/ TWL LRG LVL3 (GOWN DISPOSABLE) ×6 IMPLANT
GOWN STRL REUS W/ TWL XL LVL3 (GOWN DISPOSABLE) ×2 IMPLANT
GOWN STRL REUS W/TWL LRG LVL3 (GOWN DISPOSABLE) ×3
GOWN STRL REUS W/TWL XL LVL3 (GOWN DISPOSABLE) ×1
IRRIGATION STRYKERFLOW (MISCELLANEOUS) ×2 IMPLANT
IRRIGATOR STRYKERFLOW (MISCELLANEOUS) ×3
IV LACTATED RINGERS 1000ML (IV SOLUTION) ×3 IMPLANT
KIT PINK PAD W/HEAD ARE REST (MISCELLANEOUS)
KIT PINK PAD W/HEAD ARM REST (MISCELLANEOUS) IMPLANT
KIT TURNOVER CYSTO (KITS) ×3 IMPLANT
L-HOOK LAP DISP 36CM (ELECTROSURGICAL) ×3
LABEL OR SOLS (LABEL) IMPLANT
LHOOK LAP DISP 36CM (ELECTROSURGICAL) ×2 IMPLANT
LIGASURE VESSEL 5MM BLUNT TIP (ELECTROSURGICAL) ×3 IMPLANT
MANIPULATOR VCARE LG CRV RETR (MISCELLANEOUS) IMPLANT
MANIPULATOR VCARE SML CRV RETR (MISCELLANEOUS) IMPLANT
MANIPULATOR VCARE STD CRV RETR (MISCELLANEOUS) ×3 IMPLANT
NS IRRIG 500ML POUR BTL (IV SOLUTION) ×3 IMPLANT
PACK LAP CHOLECYSTECTOMY (MISCELLANEOUS) ×3 IMPLANT
PAD OB MATERNITY 4.3X12.25 (PERSONAL CARE ITEMS) ×3 IMPLANT
PAD PREP 24X41 OB/GYN DISP (PERSONAL CARE ITEMS) ×3 IMPLANT
PENCIL ELECTRO HAND CTR (MISCELLANEOUS) ×3 IMPLANT
PORT ACCESS TROCAR AIRSEAL 5 (TROCAR) ×3 IMPLANT
SCRUB CHG 4% DYNA-HEX 4OZ (MISCELLANEOUS) ×3 IMPLANT
SET CYSTO W/LG BORE CLAMP LF (SET/KITS/TRAYS/PACK) IMPLANT
SET TRI-LUMEN FLTR TB AIRSEAL (TUBING) ×3 IMPLANT
SET YANKAUER POOLE SUCT (MISCELLANEOUS) ×3 IMPLANT
SLEEVE ENDOPATH XCEL 5M (ENDOMECHANICALS) ×6 IMPLANT
SURGILUBE 2OZ TUBE FLIPTOP (MISCELLANEOUS) ×3 IMPLANT
SUT ENDO VLOC 180-0-8IN (SUTURE) IMPLANT
SUT MNCRL 4-0 (SUTURE) ×1
SUT MNCRL 4-0 27XMFL (SUTURE) ×2
SUT MNCRL AB 4-0 PS2 18 (SUTURE) ×3 IMPLANT
SUT VIC AB 0 CT1 36 (SUTURE) ×6 IMPLANT
SUTURE MNCRL 4-0 27XMF (SUTURE) ×2 IMPLANT
TROCAR XCEL NON-BLD 5MMX100MML (ENDOMECHANICALS) ×3 IMPLANT
TUBING INSUF HEATED (TUBING) IMPLANT
TUBING INSUFFLATION (TUBING) ×3 IMPLANT

## 2017-09-03 NOTE — Anesthesia Procedure Notes (Signed)
Procedure Name: Intubation Date/Time: 09/03/2017 10:37 AM Performed by: Nile Riggs, CRNA Pre-anesthesia Checklist: Patient identified, Emergency Drugs available, Suction available, Patient being monitored and Timeout performed Patient Re-evaluated:Patient Re-evaluated prior to induction Oxygen Delivery Method: Circle system utilized Preoxygenation: Pre-oxygenation with 100% oxygen Induction Type: IV induction Ventilation: Mask ventilation without difficulty Laryngoscope Size: Miller and 2 Grade View: Grade I Tube type: Oral Tube size: 7.5 mm Number of attempts: 1 Airway Equipment and Method: Stylet Placement Confirmation: ETT inserted through vocal cords under direct vision,  positive ETCO2,  CO2 detector and breath sounds checked- equal and bilateral Secured at: 21 cm Tube secured with: Tape Dental Injury: Teeth and Oropharynx as per pre-operative assessment

## 2017-09-03 NOTE — Discharge Instructions (Addendum)
Discharge instructions:  Call office if you have any of the following: fever >101 F, chills, shortness of breath, excessive vaginal bleeding, incision drainage or problems, leg pain or redness, or any other concerns.   Activity: Do not lift > 10 lbs for 8 weeks.  No intercourse or tampons for 8 weeks.  No driving for 1-2 weeks.   You may feel some pain in your upper right abdomen/rib and right shoulder.  This is from the gas in the abdomen for surgery. This will subside over time, please be patient!  Take 800mg  Ibuprofen and 1000mg  Tylenol around the clock, every 6 hours for at least the first 3-5 days.  After this you can take as needed.  This will help decrease inflammation and promote healing.  The narcotics you'll take just as needed, as they just trick your brain into thinking its not in pain.    Please don't limit yourself in terms of routine activity.  You will be able to do most things, although they may take longer to do or be a little painful.  You can do it!  Don't be a hero, but don't be a wimp either!     AMBULATORY SURGERY  DISCHARGE INSTRUCTIONS   1) The drugs that you were given will stay in your system until tomorrow so for the next 24 hours you should not:  A) Drive an automobile B) Make any legal decisions C) Drink any alcoholic beverage   2) You may resume regular meals tomorrow.  Today it is better to start with liquids and gradually work up to solid foods.  You may eat anything you prefer, but it is better to start with liquids, then soup and crackers, and gradually work up to solid foods.   3) Please notify your doctor immediately if you have any unusual bleeding, trouble breathing, redness and pain at the surgery site, drainage, fever, or pain not relieved by medication.    4) Additional Instructions:        Please contact your physician with any problems or Same Day Surgery at [phone], Monday through Friday 6 am to 4 pm, or [place] at  [facility] number at [phone].

## 2017-09-03 NOTE — Anesthesia Preprocedure Evaluation (Signed)
Anesthesia Evaluation  Patient identified by MRN, date of birth, ID band Patient awake    Reviewed: Allergy & Precautions, NPO status , Patient's Chart, lab work & pertinent test results  History of Anesthesia Complications (+) Family history of anesthesia reactionNegative for: history of anesthetic complications  Airway Mallampati: I       Dental   Pulmonary neg sleep apnea, neg COPD, former smoker,           Cardiovascular hypertension, Pt. on medications and Pt. 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) Supra Ventricular Tachycardia + Valvular Problems/Murmurs MVP      Neuro/Psych neg Seizures Anxiety Depression    GI/Hepatic Neg liver ROS, GERD  Medicated and Poorly Controlled,  Endo/Other  neg diabetes  Renal/GU negative Renal ROS     Musculoskeletal   Abdominal   Peds  Hematology  (+) anemia ,   Anesthesia Other Findings   Reproductive/Obstetrics                             Anesthesia Physical Anesthesia Plan  ASA: III  Anesthesia Plan: General   Post-op Pain Management:    Induction: Intravenous  PONV Risk Score and Plan: 3 and Ondansetron, Dexamethasone and Midazolam  Airway Management Planned: Oral ETT  Additional Equipment:   Intra-op Plan:   Post-operative Plan:   Informed Consent: I have reviewed the patients History and Physical, chart, labs and discussed the procedure including the risks, benefits and alternatives for the proposed anesthesia with the patient or authorized representative who has indicated his/her understanding and acceptance.     Plan Discussed with:   Anesthesia Plan Comments:         Anesthesia Quick Evaluation

## 2017-09-03 NOTE — H&P (Signed)
Preoperative History and Physical  April Guzman is a 42 y.o. with menorrhagia on perpetual progesterone and development of significant pelvic pressure and pain over the last few months, here for definitive treatment of symptoms.   No significant preoperative concerns.  Proposed surgery: Total laparoscopic hysterectomy, bilateral salpingectomy.  Past Medical History:  Diagnosis Date  . Anemia   . Anxiety   . Depression   . Dysrhythmia    SVT, unsuccessful ablation  . Family history of adverse reaction to anesthesia    Mother hard to wake up  . Generalized anxiety disorder   . GERD (gastroesophageal reflux disease)   . Headache(784.0)   . Heart murmur   . Hypertension   . Hypoglycemia 12/11/10   "I've changed my diet to deal w/it and it works"  . Pars defect   . 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 Vitamin D deficiency    Past Surgical History:  Procedure Laterality Date  . CARDIAC ELECTROPHYSIOLOGY MAPPING AND ABLATION  12/11/10  . CESAREAN SECTION  2004; 2008  . DILATION AND CURETTAGE OF UTERUS    . SUPRAVENTRICULAR TACHYCARDIA ABLATION N/A 12/11/2010   Procedure: SUPRAVENTRICULAR TACHYCARDIA ABLATION;  Surgeon: Evans Lance, MD;  Location: [facility] CATH LAB;  Service: Cardiovascular;  Laterality: N/A;  . TUBAL LIGATION     OB History  No data available  Patient denies any other pertinent gynecologic issues.   No current facility-administered medications on file prior to encounter.    Current Outpatient Medications on File Prior to Encounter  Medication Sig Dispense Refill  . acetaminophen (TYLENOL) 500 MG tablet Take 1,000 mg by mouth 2 (two) times daily as needed for moderate pain or headache.    . CRANBERRY PO Take 1 tablet by mouth every evening.    Marland Kitchen doxycycline (VIBRA-TABS) 100 MG tablet Take 100 mg by mouth 2 (two) times daily.    . ferrous sulfate 325 (65 FE) MG EC tablet Take 650 mg by mouth every evening.     . medroxyPROGESTERone  (PROVERA) 10 MG tablet Take 10 mg by mouth daily.    . metoprolol tartrate (LOPRESSOR) 25 MG tablet Take 1 tablet (25 mg total) by mouth 2 (two) times daily as needed. (Patient taking differently: Take 25 mg by mouth daily. ) 60 tablet 3  . omeprazole (PRILOSEC) 20 MG capsule Take 20 mg by mouth 2 (two) times daily.     . phentermine (ADIPEX-P) 37.5 MG tablet Take 37.5 mg by mouth daily.    . Vitamin D, Ergocalciferol, (DRISDOL) 50000 units CAPS capsule Take 50,000 Units by mouth every Saturday.      Allergies  Allergen Reactions  . Cinnamon Swelling    Throat swelling  . Morphine Hives, Itching, Nausea Only and Rash  . Latex Itching  . Macrobid [Nitrofurantoin Monohydrate Macrocrystals] Nausea Only and Rash    Social History:   reports that she has been smoking. She has been smoking about 0.00 packs per day for the past 0.00 years. She has never used smokeless tobacco. She reports that she does not drink alcohol or use drugs.  Family History  Problem Relation Age of Onset  . Breast cancer Mother 17    Review of Systems: Noncontributory  PHYSICAL EXAM: Blood pressure (!) 145/83, pulse 66, temperature (!) 97.4 F (36.3 C), temperature source Tympanic, resp. rate 16, height 5\' 5"  (1.651 m), weight 74 kg, last menstrual period 09/03/2016, SpO2 100 %. General appearance - alert, well  appearing, and in no distress Chest - clear to auscultation, no wheezes, rales or rhonchi, symmetric air entry Heart - normal rate and regular rhythm Abdomen - soft, nontender, nondistended, no masses or organomegaly Pelvic - examination not indicated Extremities - peripheral pulses normal, no pedal edema, no clubbing or cyanosis  Labs: Results for orders placed or performed during the hospital encounter of 09/03/17 (from the past 336 hour(s))  Pregnancy, urine POC   Collection Time: 09/03/17  8:20 AM  Result Value Ref Range   Preg Test, Ur NEGATIVE NEGATIVE  ABO/Rh   Collection Time: 09/03/17  8:20  AM  Result Value Ref Range   ABO/RH(D)      O POS Performed at [facility], [address] [place], [place] [address]   Results for orders placed or performed during the hospital encounter of 08/26/17 (from the past 336 hour(s))  CBC   Collection Time: 08/26/17  8:53 AM  Result Value Ref Range   WBC 5.2 3.6 - 11.0 K/uL   RBC 4.47 3.80 - 5.20 MIL/uL   Hemoglobin 13.1 12.0 - 16.0 g/dL   HCT 37.9 35.0 - 47.0 %   MCV 84.8 80.0 - 100.0 fL   MCH 29.2 26.0 - 34.0 pg   MCHC 34.5 32.0 - 36.0 g/dL   RDW 14.0 11.5 - 14.5 %   Platelets 325 150 - 440 K/uL  Basic metabolic panel   Collection Time: 08/26/17  8:53 AM  Result Value Ref Range   Sodium 135 135 - 145 mmol/L   Potassium 3.8 3.5 - 5.1 mmol/L   Chloride 106 98 - 111 mmol/L   CO2 22 22 - 32 mmol/L   Glucose, Bld 102 (H) 70 - 99 mg/dL   BUN 14 6 - 20 mg/dL   Creatinine, Ser 0.54 0.44 - 1.00 mg/dL   Calcium 9.1 8.9 - 10.3 mg/dL   GFR calc non Af Amer >60 >60 mL/min   GFR calc Af Amer >60 >60 mL/min   Anion gap 7 5 - 15  Type and screen [place]   Collection Time: 08/26/17  8:58 AM  Result Value Ref Range   ABO/RH(D) O POS    Antibody Screen NEG    Sample Expiration 09/09/2017    Extend sample reason      NO TRANSFUSIONS OR PREGNANCY IN THE PAST 3 MONTHS Performed at [facility], [address] [place], [place] [address]     Imaging Studies: Dg Esophagus  Result Date: 08/06/2017 CLINICAL DATA:  Dysphagia.  Lump in throat. EXAM: ESOPHOGRAM / BARIUM SWALLOW / BARIUM TABLET STUDY TECHNIQUE: Combined double contrast and single contrast examination performed using effervescent crystals, thick barium liquid, and thin barium liquid. The patient was observed with fluoroscopy swallowing a 13 mm barium sulphate tablet. FLUOROSCOPY TIME:  Fluoroscopy Time:  1 minutes 6 seconds Radiation Exposure Index (if provided by the fluoroscopic device): 22.5 mGy Number of Acquired Spot Images: 25  COMPARISON:  06/24/2012. FINDINGS: Cervical esophagus is normal. No evidence of aspiration. Thoracic esophagus is normal. Normal peristalsis. No evidence of hiatal hernia or reflux. Standardized barium tablet passed normally. IMPRESSION: Normal exam. Electronically Signed   By: Marcello Moores  Register   On: 08/06/2017 08:29    Assessment: Patient Active Problem List   Diagnosis Date Noted  . Pelvic pain 09/03/2017  . Menorrhagia 09/03/2017    Plan: Patient will undergo surgical management with TL[facility] BS.   The risks of surgery were discussed in detail with the  patient including but not limited to: bleeding which may require transfusion or reoperation; infection which may require antibiotics; injury to surrounding organs which may involve bowel, bladder, ureters ; need for additional procedures including laparoscopy or laparotomy; thromboembolic phenomenon, surgical site problems and other postoperative/anesthesia complications. Likelihood of success in alleviating the patient's condition was discussed. Routine postoperative instructions will be reviewed with the patient and her family in detail after surgery.  The patient concurred with the proposed plan, giving informed written consent for the surgery.  Patient has been NPO since last night she will remain NPO for procedure.  Anesthesia and OR aware.  Preoperative prophylactic antibiotics and SCDs ordered on call to the OR.  To OR when ready.  ----- Larey Days, MD Attending Obstetrician and Gynecologist [facility], Department of [facility]

## 2017-09-03 NOTE — Op Note (Signed)
Total Laparoscopic Hysterectomy Operative Note Procedure Date: 09/03/2017  Patient:  April Guzman  42 y.o. female  PRE-OPERATIVE DIAGNOSIS:  menorrhagia, pelvic pain  POST-OPERATIVE DIAGNOSIS:  menorrhagia, pelvic pain  PROCEDURE:  Procedure(s): HYSTERECTOMY TOTAL LAPAROSCOPIC (N/A) LAPAROSCOPIC BILATERAL SALPINGECTOMY (Bilateral)  SURGEON:  Surgeon(s) and Role:    * Maleiyah Releford, Honor Loh, MD - Primary    * Benjaman Kindler, MD - Assisting  ANESTHESIA:  General via ET  I/O  Total I/O In: 1800 [I.V.:1800] Out: 130 [Urine:100; Blood:30]  FINDINGS:  Globular, mobile uterus, normal ovaries and surgically altered fallopian tubes bilaterally.  Normal upper abdomen.  SPECIMEN: Uterus, Cervix, and bilateral fallopian tubes  COMPLICATIONS: none apparent  DISPOSITION: vital signs stable to PACU  Indication for Surgery: 42 y.o. with recent onset of heavy pelvic pressure, frequent urination, and left-sided pain.  She has known history of menorrhagia and has been treated with long-term progesterone.   Risks of surgery were discussed with the patient including but not limited to: bleeding which may require transfusion or reoperation; infection which may require antibiotics; injury to bowel, bladder, ureters or other surrounding organs; need for additional procedures including laparotomy, blood clot, incisional problems and other postoperative/anesthesia complications. Written informed consent was obtained.      PROCEDURE IN DETAIL:  The patient had 5000u Heparin Sub-q and sequential compression devices applied to her lower extremities while in the preoperative area.  She was then taken to the operating room. IV antibiotics were given. General anesthesia was administered via endotracheal route.  She was placed in the dorsal lithotomy position, and was prepped and draped in a sterile manner. A surgical time-out was performed.  A Foley catheter was inserted into her bladder and attached to constant  drainage and a V-Care uterine manipulator was then advanced into the uterus and a good fit around the cervix was noted. The gloves were changed, and attention was turned to the abdomen where an umbilical incision was made with the scalpel.  A 80mm trochar was inserted in the umbilical incision using a visiport method.Opening pressure was 68mmHg, and the abdomen was insufflated to 7mmHg carbon dioxide gas and adequate pneumoperitoneum was obtained. A survey of the patient's pelvis and abdomen revealed the findings as mentioned above. Two 56mm ports were inserted in the lower left and right quadrants under visualization.    The bilateral fallopian tubes were separated from the mesosalpinx using the Ligasure. The bilateral round ligaments were transected and anterior broad ligament divided and brought across the uterus to separate the vesicouterine peritoneum and create a bladder flap. The bladder was pushed away from the uterus. The bilateral uterine arteries were skeletonized, ligated and transected. The bilateral uterosacral and cardinal ligaments were ligated and transected. A colpotomy was made around the V-Care cervical cup and the uterus, cervix, and bilateral tubes were removed through the vagina. The vaginal cuff was closed vaginally using 0-Vicryl in a running stitch. This was tested for integrity using the surgeon's finger. After a change of gloves, the pneumoperitoneum was recreated and surgical site inspected, and found to be hemostatic. Bilateral ureters were visualized vermiuclating. No intraoperative injury to surrounding organs was noted. The abdomen was desufflated and all instruments were then removed.   All skin incisions were closed with 4-0 monocryl and covered with surgical glue. The patient tolerated the procedures well.  All instruments, needles, and sponge counts were correct x 2. The patient was taken to the recovery room in stable condition.   ---- Larey Days,  MD Attending  [facility]

## 2017-09-03 NOTE — OR Nursing (Signed)
Dr. Leonides Schanz in to see pt in postop.  Pt awake and alert, pain controlled, taking po's without nausea.  Discharge pending void.

## 2017-09-03 NOTE — Transfer of Care (Signed)
Immediate Anesthesia Transfer of Care Note  Patient: April Guzman  Procedure(s) Performed: HYSTERECTOMY TOTAL LAPAROSCOPIC (N/A ) LAPAROSCOPIC BILATERAL SALPINGECTOMY (Bilateral )  Patient Location: PACU  Anesthesia Type:General  Level of Consciousness: awake, alert , oriented and patient cooperative  Airway & Oxygen Therapy: Patient Spontanous Breathing and Patient connected to face mask oxygen  Post-op Assessment: Report given to RN, Post -op Vital signs reviewed and stable and Patient moving all extremities  Post vital signs: Reviewed and stable  Last Vitals:  Vitals Value Taken Time  BP 123/66 09/03/2017 12:45 PM  Temp 36.1 C 09/03/2017 12:45 PM  Pulse    Resp    SpO2      Last Pain:  Vitals:   09/03/17 0827  TempSrc: Tympanic  PainSc: 0-No pain         Complications: No apparent anesthesia complications

## 2017-09-03 NOTE — OR Nursing (Signed)
Dr. Leonides Schanz notiffied verbal of patient's void by Gita Kudo, RN.

## 2017-09-03 NOTE — Anesthesia Post-op Follow-up Note (Signed)
Anesthesia QCDR form completed.        

## 2017-09-03 NOTE — OR Nursing (Signed)
To bathroom 1730, void (large amt per patient) "it missed the hat".  Amber urine visualized in toilet.  Patient advises "it was a lot"

## 2017-09-03 NOTE — Anesthesia Postprocedure Evaluation (Signed)
Anesthesia Post Note  Patient: April Guzman  Procedure(s) Performed: HYSTERECTOMY TOTAL LAPAROSCOPIC (N/A ) LAPAROSCOPIC BILATERAL SALPINGECTOMY (Bilateral )  Patient location during evaluation: PACU Anesthesia Type: General Level of consciousness: awake and alert Pain management: pain level controlled Vital Signs Assessment: post-procedure vital signs reviewed and stable Respiratory status: spontaneous breathing and respiratory function stable Cardiovascular status: stable Anesthetic complications: no     Last Vitals:  Vitals:   09/03/17 0827 09/03/17 1245  BP: (!) 145/83 123/66  Pulse: 66 (P) 93  Resp: 16   Temp: (!) 36.3 C (!) 36.1 C  SpO2: 100% (P) 100%    Last Pain:  Vitals:   09/03/17 1245  TempSrc:   PainSc: (P) 0-No pain                 Lorma Heater K

## 2017-09-03 NOTE — OR Nursing (Signed)
2nd try at bathroom for void without results.  Bladder scan 375cc as requested by Dr. Leonides Schanz.   Lasix 78m IV given as ordered.  OK to discharge pt to home without void.  Patient instructed by Dr. WLeonides Schanzthat she will call pt at home around 7pm to check on her.  Patient also given straight cath kit to take home.  MD will discuss with patient if it needs to be used at home.

## 2017-09-04 ENCOUNTER — Encounter: Payer: Self-pay | Admitting: Obstetrics & Gynecology

## 2017-09-08 LAB — SURGICAL PATHOLOGY

## 2017-09-26 ENCOUNTER — Encounter: Payer: Self-pay | Admitting: Emergency Medicine

## 2017-09-26 ENCOUNTER — Other Ambulatory Visit: Payer: Self-pay

## 2017-09-26 ENCOUNTER — Emergency Department
Admission: EM | Admit: 2017-09-26 | Discharge: 2017-09-26 | Disposition: A | Discharge status: Home / Self Care | Payer: BLUE CROSS/BLUE SHIELD | Attending: Emergency Medicine | Admitting: Emergency Medicine

## 2017-09-26 DIAGNOSIS — N939 Abnormal uterine and vaginal bleeding, unspecified: Secondary | ICD-10-CM | POA: Diagnosis present

## 2017-09-26 DIAGNOSIS — Z91040 Latex allergy status: Secondary | ICD-10-CM | POA: Diagnosis not present

## 2017-09-26 DIAGNOSIS — N9989 Other postprocedural complications and disorders of genitourinary system: Secondary | ICD-10-CM | POA: Insufficient documentation

## 2017-09-26 DIAGNOSIS — Z79899 Other long term (current) drug therapy: Secondary | ICD-10-CM | POA: Insufficient documentation

## 2017-09-26 DIAGNOSIS — F17200 Nicotine dependence, unspecified, uncomplicated: Secondary | ICD-10-CM | POA: Diagnosis not present

## 2017-09-26 DIAGNOSIS — I10 Essential (primary) hypertension: Secondary | ICD-10-CM | POA: Insufficient documentation

## 2017-09-26 LAB — BASIC METABOLIC PANEL
ANION GAP: 10 (ref 5–15)
BUN: 18 mg/dL (ref 6–20)
CHLORIDE: 101 mmol/L (ref 98–111)
CO2: 25 mmol/L (ref 22–32)
CREATININE: 0.63 mg/dL (ref 0.44–1.00)
Calcium: 9.3 mg/dL (ref 8.9–10.3)
GFR calc non Af Amer: >60 mL/min (ref >60)
Glucose, Bld: 108 mg/dL — ABNORMAL HIGH (ref 70–99)
POTASSIUM: 3.6 mmol/L (ref 3.5–5.1)
SODIUM: 136 mmol/L (ref 135–145)

## 2017-09-26 LAB — CBC
HCT: 40.2 % (ref 35.0–47.0)
HEMOGLOBIN: 14 g/dL (ref 12.0–16.0)
MCH: 29 pg (ref 26.0–34.0)
MCHC: 34.7 g/dL (ref 32.0–36.0)
MCV: 83.5 fL (ref 80.0–100.0)
Platelets: 378 10*3/uL (ref 150–440)
RBC: 4.82 MIL/uL (ref 3.80–5.20)
RDW: 13.6 % (ref 11.5–14.5)
WBC: 8.3 10*3/uL (ref 3.6–11.0)

## 2017-09-26 NOTE — ED Provider Notes (Signed)
[facility] Emergency Department Provider Note  ____________________________________________  Time seen: Approximately 5:07 PM  I have reviewed the triage vital signs and the nursing notes.   HISTORY  Chief Complaint Vaginal Bleeding   HPI April Guzman is a 42 y.o. female with history as listed below who presents for evaluation of vaginal bleeding status post total hysterectomy 3 weeks ago at [facility].  Patient reports that she was cleared by her surgeon on Friday to be able to vacuum and do daily chores.  She reports that she went to the mall yesterday and spent several hours walking.  When she came home she started having vaginal bleeding which has persisted throughout the night and today.  She called Dr. Guido Sander office and was instructed to come to the emergency room for evaluation.  She denies pain, dysuria, fever, dizziness, chest pain or shortness of breath.  She reports that she has been passing large clots throughout the night.  She is not on blood thinners.  Past Medical History:  Diagnosis Date  . Anemia   . Anxiety   . Depression   . Dysrhythmia    SVT, unsuccessful ablation  . Family history of adverse reaction to anesthesia    Mother hard to wake up  . Generalized anxiety disorder   . GERD (gastroesophageal reflux disease)   . Headache(784.0)   . Heart murmur   . Hypertension   . Hypoglycemia 12/11/10   "I've changed my diet to deal w/it and it works"  . Pars defect   . 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 Vitamin D deficiency     Patient Active Problem List   Diagnosis Date Noted  . Pelvic pain 09/03/2017  . Menorrhagia 09/03/2017  . Neck fullness 12/17/2010  . Acidemia-bicarbonate 18 12/17/2010  . Hyperkalemia 12/17/2010  . Blood pressure elevated 10/10/2010  . Supraventricular tachycardia  07/01/2010  . Back pain-disabled 07/01/2010  . Mitral valve prolapse 07/01/2010    Past Surgical  History:  Procedure Laterality Date  . CARDIAC ELECTROPHYSIOLOGY MAPPING AND ABLATION  12/11/10  . CESAREAN SECTION  2004; 2008  . DILATION AND CURETTAGE OF UTERUS    . LAPAROSCOPIC BILATERAL SALPINGECTOMY Bilateral 09/03/2017   Procedure: LAPAROSCOPIC BILATERAL SALPINGECTOMY;  Surgeon: Ward, Honor Loh, MD;  Location: [facility] ORS;  Service: Gynecology;  Laterality: Bilateral;  . LAPAROSCOPIC HYSTERECTOMY N/A 09/03/2017   Procedure: HYSTERECTOMY TOTAL LAPAROSCOPIC;  Surgeon: Ward, Honor Loh, MD;  Location: [facility] ORS;  Service: Gynecology;  Laterality: N/A;  . SUPRAVENTRICULAR TACHYCARDIA ABLATION N/A 12/11/2010   Procedure: SUPRAVENTRICULAR TACHYCARDIA ABLATION;  Surgeon: Evans Lance, MD;  Location: [facility] CATH LAB;  Service: Cardiovascular;  Laterality: N/A;  . TUBAL LIGATION      Prior to Admission medications   Medication Sig Start Date End Date Taking? Authorizing Provider  acetaminophen (TYLENOL) 500 MG tablet Take 1,000 mg by mouth 2 (two) times daily as needed for moderate pain or headache.    [provider]  CRANBERRY PO Take 1 tablet by mouth every evening.    [provider]  ferrous sulfate 325 (65 FE) MG EC tablet Take 650 mg by mouth every evening.     [provider]  ibuprofen (ADVIL,MOTRIN) 800 MG tablet Take 1 tablet (800 mg total) by mouth every 6 (six) hours. 09/03/17   Ward, Honor Loh, MD  metoprolol tartrate (LOPRESSOR) 25 MG tablet Take 1 tablet (25 mg total) by mouth 2 (two) times  daily as needed. Patient taking differently: Take 25 mg by mouth daily.  12/23/10 08/26/17  Minna Merritts, MD  omeprazole (PRILOSEC) 20 MG capsule Take 20 mg by mouth 2 (two) times daily.     [provider]  ondansetron (ZOFRAN ODT) 4 MG disintegrating tablet Take 1 tablet (4 mg total) by mouth every 4 (four) hours as needed for nausea or vomiting. 09/03/17   Ward, Honor Loh, MD  oxyCODONE (ROXICODONE) 5 MG immediate release tablet Take 1 tablet (5 mg total) by  mouth every 4 (four) hours as needed. 09/03/17 09/03/18  Ward, Honor Loh, MD  phentermine (ADIPEX-P) 37.5 MG tablet Take 37.5 mg by mouth daily.    [provider]  Vitamin D, Ergocalciferol, (DRISDOL) 50000 units CAPS capsule Take 50,000 Units by mouth every Saturday.  04/24/16   [provider]    Allergies Cinnamon; Morphine; Latex; and Macrobid [nitrofurantoin monohydrate macrocrystals]  Family History  Problem Relation Age of Onset  . Breast cancer Mother 68    Social History Social History   Tobacco Use  . Smoking status: Current Every Day Smoker    Packs/day: 0.00    Years: 0.00    Pack years: 0.00  . Smokeless tobacco: Never Used  . Tobacco comment: many years ago  Substance Use Topics  . Alcohol use: No  . Drug use: No    Review of Systems  Constitutional: Negative for fever. Eyes: Negative for visual changes. ENT: Negative for sore throat. Neck: No neck pain  Cardiovascular: Negative for chest pain. Respiratory: Negative for shortness of breath. Gastrointestinal: Negative for abdominal pain, vomiting or diarrhea. Genitourinary: Negative for dysuria. + vaginal bleeding Musculoskeletal: Negative for back pain. Skin: Negative for rash. Neurological: Negative for headaches, weakness or numbness. Psych: No SI or HI  ____________________________________________   PHYSICAL EXAM:  VITAL SIGNS: ED Triage Vitals  Enc Vitals Group     BP 09/26/17 1601 (!) 156/96     Pulse Rate 09/26/17 1601 (!) 108     Resp 09/26/17 1606 20     Temp 09/26/17 1601 98.2 F (36.8 C)     Temp Source 09/26/17 1601 Oral     SpO2 09/26/17 1601 94 %     Weight 09/26/17 1601 159 lb (72.1 kg)     Height 09/26/17 1601 5\' 5"  (1.651 m)     Head Circumference --      Peak Flow --      Pain Score 09/26/17 1606 0     Pain Loc --      Pain Edu? --      Excl. in [place]? --     Constitutional: Alert and oriented. Well appearing and in no apparent distress. HEENT:       Head: Normocephalic and atraumatic.         Eyes: Conjunctivae are normal. Sclera is non-icteric.       Mouth/Throat: Mucous membranes are moist.       Neck: Supple with no signs of meningismus. Cardiovascular: Regular rate and rhythm. No murmurs, gallops, or rubs. 2+ symmetrical distal pulses are present in all extremities. No JVD. Respiratory: Normal respiratory effort. Lungs are clear to auscultation bilaterally. No wheezes, crackles, or rhonchi.  Gastrointestinal: Soft, non tender, and non distended with positive bowel sounds. No rebound or guarding. Pelvic exam: Normal external genitalia, no rashes or lesions.  No evidence of dehiscence of surgical site with a clot in the vaginal vault which was evacuated and very minimum amount of  oozing coming from the surgical site Musculoskeletal: Nontender with normal range of motion in all extremities. No edema, cyanosis, or erythema of extremities. Neurologic: Normal speech and language. Face is symmetric. Moving all extremities. No gross focal neurologic deficits are appreciated. Skin: Skin is warm, dry and intact. No rash noted. Psychiatric: Mood and affect are normal. Speech and behavior are normal.  ____________________________________________   LABS (all labs ordered are listed, but only abnormal results are displayed)  Labs Reviewed  BASIC METABOLIC PANEL - Abnormal; Notable for the following components:      Result Value   Glucose, Bld 108 (*)    All other components within normal limits  CBC   ____________________________________________  EKG  none  ____________________________________________  RADIOLOGY  none  ____________________________________________   PROCEDURES  Procedure(s) performed: None Procedures Critical Care performed:  None ____________________________________________   INITIAL IMPRESSION / ASSESSMENT AND PLAN / ED COURSE  42 y.o. female with history as listed below who presents for evaluation of  vaginal bleeding status post total hysterectomy 3 weeks ago at [facility].  Patient is hemodynamically stable, labs showing normal hemoglobin, pelvic exam showing minimal oozing from surgical site with no evidence of dehiscence.  Discussed with Dr. Ouida Sills who is on-call for [facility] clinic who recommended rest and follow-up with Dr. Leonides Schanz in the office tomorrow.  Discussed signs and symptoms of acute blood loss anemia with the patient and recommended return to the emergency room if these develop.      As part of my medical decision making, I reviewed the following data within the [facility] notes reviewed and incorporated, Labs reviewed , Old chart reviewed, A consult was requested and obtained from this/these consultant(s) ObGYN, Notes from prior ED visits and [place] Controlled Substance Database    Pertinent labs & imaging results that were available during my care of the patient were reviewed by me and considered in my medical decision making (see chart for details).    ____________________________________________   FINAL CLINICAL IMPRESSION(S) / ED DIAGNOSES  Final diagnoses:  Postoperative surgical complication involving genitourinary system associated with genitourinary procedure, unspecified complication      NEW MEDICATIONS STARTED DURING THIS VISIT:  ED Discharge Orders    None       Note:  This document was prepared using Dragon voice recognition software and may include unintentional dictation errors.    Alfred Levins, [place], MD 09/26/17 [phone]

## 2017-09-26 NOTE — Discharge Instructions (Signed)
Rest, elevate your legs, no sexual activity or tampons, call Dr. Leonides Schanz in the morning if bleeding persists.  Return to the emergency room if you feel dizzy like you are going to pass out or if you feel that the bleeding is excessive.

## 2017-09-26 NOTE — ED Notes (Signed)
Pt wore pad from home, soaked in 20 min. Pt given another pad at this time.

## 2017-09-26 NOTE — ED Triage Notes (Signed)
First Nurse Note:  S/P [facility] 3 weeks ago.  Patient has had vaginal bleeding since surgery, but states bleeding became heavy today, passing clots.  Dr. Leonides Schanz was called by patient and referred to ED for evaluation.  Patient is AAOx3.  Skin warm and dry. NAD. Refused offer of wheelchair.

## 2017-09-26 NOTE — ED Triage Notes (Signed)
Here for vaginal bleeding that started last night. Pt soaked a pad in last 15 minutes per report. Had hysterectomy 3 weeks ago and has been doing well until last night. reports no bleeding if sitting but when stands gushes out with large clots. Ambulatory. Denies pain. VSS.

## 2018-11-30 ENCOUNTER — Other Ambulatory Visit: Payer: Self-pay | Admitting: Obstetrics & Gynecology

## 2018-11-30 DIAGNOSIS — R109 Unspecified abdominal pain: Secondary | ICD-10-CM

## 2019-06-19 ENCOUNTER — Other Ambulatory Visit: Payer: Self-pay | Admitting: Obstetrics & Gynecology

## 2019-06-19 DIAGNOSIS — Z1231 Encounter for screening mammogram for malignant neoplasm of breast: Secondary | ICD-10-CM

## 2019-06-21 ENCOUNTER — Ambulatory Visit
Admission: RE | Admit: 2019-06-21 | Discharge: 2019-06-21 | Disposition: A | Discharge status: Home / Self Care | Payer: BC Managed Care – PPO | Source: Ambulatory Visit | Attending: Obstetrics & Gynecology | Admitting: Obstetrics & Gynecology

## 2019-06-21 DIAGNOSIS — Z1231 Encounter for screening mammogram for malignant neoplasm of breast: Secondary | ICD-10-CM | POA: Diagnosis present

## 2019-06-26 NOTE — Progress Notes (Addendum)
Established Patient Office Visit  SUBJECTIVE:  Patient ID: April Guzman, female    DOB: 11-03-1975  Age: 44 y.o. MRN: [id number]  CC:  Chief Complaint  Patient presents with  . heart flutter    patient has had heart flutters and palpitations   . Dizziness    having a lot of dizziness since hitting head a couple of months ago  . Hypertension    patient has not been taking anything for blood pressure and states that her blood pressure has been staying elevated     HPI April Guzman presents for headaches, dizziness, and right leg swelling.   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   She notes that on [employer] Patricks Day, she lost her balance while stretching and she fell and hit her head, primarily on the right side. She notes her headaches started immediately after she hit her head. She has constantly had a headache that has not gone away since then; she states that her scalp in sensitive to the touch in certain places. She did go to they eye doctor and she notes that she did not have any swelling or bleeding around or behind her eyes. She does have periods of intermittent dizziness.  She notes that she called EMS several nights ago for palpitations; and EKG was performed and was normal. She denied going to the hospital since she had an upcoming appointment with Dr. Lavera Guise. She notes that her blood pressure during the EMS examination was 200/108. Chelsea Ward   She was on Metoprolol 25mg , but was lost to follow up due to COVID-19. She notes that she is trying to work on her diet, including trying to eat less carbohydrates and sugars.     Past Medical History:  Diagnosis Date  . Anemia   . Anxiety   . Depression   . Dysrhythmia    SVT, unsuccessful ablation  . Family history of adverse reaction to anesthesia    Mother hard to wake up  . Generalized anxiety disorder   . GERD (gastroesophageal reflux disease)   . Headache(784.0)   . Heart murmur   . Hypertension   .  Hypoglycemia 12/11/10   "I've changed my diet to deal w/it and it works"  . Pars defect   . 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 Vitamin D deficiency     Past Surgical History:  Procedure Laterality Date  . CARDIAC ELECTROPHYSIOLOGY MAPPING AND ABLATION  12/11/10  . CESAREAN SECTION  2004; 2008  . DILATION AND CURETTAGE OF UTERUS    . LAPAROSCOPIC BILATERAL SALPINGECTOMY Bilateral 09/03/2017   Procedure: LAPAROSCOPIC BILATERAL SALPINGECTOMY;  Surgeon: Ward, Honor Loh, MD;  Location: [facility] ORS;  Service: Gynecology;  Laterality: Bilateral;  . LAPAROSCOPIC HYSTERECTOMY N/A 09/03/2017   Procedure: HYSTERECTOMY TOTAL LAPAROSCOPIC;  Surgeon: Ward, Honor Loh, MD;  Location: [facility] ORS;  Service: Gynecology;  Laterality: N/A;  . SUPRAVENTRICULAR TACHYCARDIA ABLATION N/A 12/11/2010   Procedure: SUPRAVENTRICULAR TACHYCARDIA ABLATION;  Surgeon: Evans Lance, MD;  Location[facility] LAB;  Service: Cardiovascular;  Laterality: N/A;  . TUBAL LIGATION      Family History  Problem Relation Age of Onset  . Breast cancer Mother 30    Social History   Socioeconomic History  . Marital status: Married    Spouse name: Not on file  . Number of children: Not on file  . Years of education: Not on file  . Highest  education level: Not on file  Occupational History  . Not on file  Tobacco Use  . Smoking status: Current Every Day Smoker    Packs/day: 0.00    Years: 0.00    Pack years: 0.00  . Smokeless tobacco: Never Used  . Tobacco comment: many years ago  Vaping Use  . Vaping Use: Never used  Substance and Sexual Activity  . Alcohol use: No  . Drug use: No  . Sexual activity: Yes  Other Topics Concern  . Not on file  Social History Narrative  . Not on file   Social Determinants of Health   Financial Resource Strain:   . Difficulty of Paying Living Expenses:   Food Insecurity:   . Worried About Charity fundraiser in the Last Year:   . Arboriculturist in the  Last Year:   Transportation Needs:   . Film/video editor (Medical):   Marland Kitchen Lack of Transportation (Non-Medical):   Physical Activity:   . Days of Exercise per Week:   . Minutes of Exercise per Session:   Stress:   . Feeling of Stress :   Social Connections:   . Frequency of Communication with Friends and Family:   . Frequency of Social Gatherings with Friends and Family:   . Attends Religious Services:   . Active Member of Clubs or Organizations:   . Attends Archivist Meetings:   Marland Kitchen Marital Status:   Intimate Partner Violence:   . Fear of Current or Ex-Partner:   . Emotionally Abused:   Marland Kitchen Physically Abused:   . Sexually Abused:      Current Outpatient Medications:  .  acetaminophen (TYLENOL) 500 MG tablet, Take 1,000 mg by mouth 2 (two) times daily as needed for moderate pain or headache., Disp: , Rfl:  .  CRANBERRY PO, Take 1 tablet by mouth every evening. (Patient not taking: Reported on 06/27/2019), Disp: , Rfl:  .  ferrous sulfate 325 (65 FE) MG EC tablet, Take 650 mg by mouth every evening.  (Patient not taking: Reported on 06/27/2019), Disp: , Rfl:  .  ibuprofen (ADVIL,MOTRIN) 800 MG tablet, Take 1 tablet (800 mg total) by mouth every 6 (six) hours., Disp: 45 tablet, Rfl: 0 .  metoprolol succinate (TOPROL-XL) 25 MG 24 hr tablet, Take 1 tablet (25 mg total) by mouth daily., Disp: 90 tablet, Rfl: 3 .  omeprazole (PRILOSEC) 20 MG capsule, Take 20 mg by mouth 2 (two) times daily. , Disp: , Rfl:  .  Vitamin D, Ergocalciferol, (DRISDOL) 50000 units CAPS capsule, Take 50,000 Units by mouth every Saturday. , Disp: , Rfl:    Allergies  Allergen Reactions  . Cinnamon Swelling    Throat swelling  . Morphine Hives, Itching, Nausea Only and Rash  . Latex Itching  . Macrobid [Nitrofurantoin Monohydrate Macrocrystals] Nausea Only and Rash    ROS Review of Systems    OBJECTIVE:    Physical Exam HENT:     Mouth/Throat:     Mouth: Mucous membranes are moist.      Pharynx: No pharyngeal swelling or posterior oropharyngeal erythema.  Eyes:     General: Lids are normal.        Right eye: No foreign body or discharge.        Left eye: No foreign body or discharge.     Extraocular Movements:     Right eye: Normal extraocular motion.     Left eye: Normal extraocular motion.  Conjunctiva/sclera:     Right eye: No hemorrhage.    Left eye: No hemorrhage. Neck:     Vascular: No carotid bruit.  Cardiovascular:     Heart sounds: Murmur (2/6) heard.      BP (!) 158/95   Pulse 95   Ht 5\' 7"  (1.702 m)   Wt 181 lb 3.2 oz (82.2 kg)   LMP 09/03/2016 (Approximate)   BMI 28.38 kg/m  Wt Readings from Last 3 Encounters:  06/27/19 181 lb 3.2 oz (82.2 kg)  09/26/17 159 lb (72.1 kg)  09/03/17 163 lb 1 oz (74 kg)    Health Maintenance Due  Topic Date Due  . Hepatitis C Screening  Never done  . COVID-19 Vaccine (1) Never done  . HIV Screening  Never done  . TETANUS/TDAP  Never done  . PAP SMEAR-Modifier  Never done    There are no preventive care reminders to display for this patient.  CBC Latest Ref Rng & Units 06/29/2019 09/26/2017 08/26/2017  WBC 4.0 - 10.5 K/uL 4.7 8.3 5.2  Hemoglobin 12.0 - 15.0 g/dL 13.1 14.0 13.1  Hematocrit 36 - 46 % 38.1 40.2 37.9  Platelets 150 - 400 K/uL 296 378 325   CMP Latest Ref Rng & Units 06/29/2019 09/26/2017 08/26/2017  Glucose 70 - 99 mg/dL 109(H) 108(H) 102(H)  BUN 6 - 20 mg/dL 12 18 14   Creatinine 0.44 - 1.00 mg/dL 0.63 0.63 0.54  Sodium 135 - 145 mmol/L 136 136 135  Potassium 3.5 - 5.1 mmol/L 3.8 3.6 3.8  Chloride 98 - 111 mmol/L 104 101 106  CO2 22 - 32 mmol/L 22 25 22   Calcium 8.9 - 10.3 mg/dL 9.2 9.3 9.1  Total Protein 6.4 - 8.2 g/dL - - -  Total Bilirubin 0.2 - 1.0 mg/dL - - -  Alkaline Phos 50 - 136 Unit/L - - -  AST 15 - 37 Unit/L - - -  ALT 12 - 78 U/L - - -    No results found for: TSH Lab Results  Component Value Date   ALBUMIN 3.9 06/24/2012   ANIONGAP 10 06/29/2019   No results found  for: CHOL, HDL, LDLCALC, CHOLHDL No results found for: TRIG No results found for: HGBA1C    ASSESSMENT & PLAN:   Problem List Items Addressed This Visit      Cardiovascular and Mediastinum   Supraventricular tachycardia     On Metroprolol 25 mg p.o. daily      Relevant Medications   metoprolol succinate (TOPROL-XL) 25 MG 24 hr tablet   Mitral valve prolapse    Chronic problem with intermittent tachycardia      Relevant Medications   metoprolol succinate (TOPROL-XL) 25 MG 24 hr tablet   Essential hypertension    - Today, the patient's blood pressure is well managed on betablockers. - The patient will continue the current treatment regimen.  - I encouraged the patient to eat a low-sodium diet to help control blood pressure. - I encouraged the patient to live an active lifestyle and complete activities that increases heart rate to 85% target heart rate at least 5 times per week for one hour.         Relevant Medications   metoprolol succinate (TOPROL-XL) 25 MG 24 hr tablet     Other   Obesity (BMI 30-39.9)    .  Patient has been advised to lose weight.       Other Visit Diagnoses    Fluttering heart    -  Primary   Relevant Medications   metoprolol succinate (TOPROL-XL) 25 MG 24 hr tablet   Other Relevant Orders   EKG 12-Lead   Tachyarrhythmia       Relevant Medications   metoprolol succinate (TOPROL-XL) 25 MG 24 hr tablet      Meds ordered this encounter  Medications  . metoprolol succinate (TOPROL-XL) 25 MG 24 hr tablet    Sig: Take 1 tablet (25 mg total) by mouth daily.    Dispense:  90 tablet    Refill:  3   Fluttering heart - Plan: EKG 12-Lead, metoprolol succinate (TOPROL-XL) 25 MG 24 hr tablet  Tachyarrhythmia  Obesity (BMI 30-39.9)  Essential hypertension  Supraventricular tachycardia   Mitral valve prolapse Follow-up: Return in about 3 months (around 09/27/2019) for Follow Up.   Electrocardiogram report  .  Nonspecific ST changes were  noted.  Echocardiogram is considered normal  Dr. Jane Canary [facility] [address], [place], [place] [address]   By signing my name below, I, [employer], attest that this documentation has been prepared under the direction and in the presence of Cletis Athens, MD. Electronically Signed: Cletis Athens, MD 07/12/19, 11:40 AM   I personally performed the services described in this documentation, which was SCRIBED in my presence. The recorded information has been reviewed and considered accurate. It has been edited as necessary during review. Cletis Athens, MD

## 2019-06-27 ENCOUNTER — Ambulatory Visit (INDEPENDENT_AMBULATORY_CARE_PROVIDER_SITE_OTHER): Payer: BC Managed Care – PPO | Admitting: Internal Medicine

## 2019-06-27 ENCOUNTER — Encounter: Payer: Self-pay | Admitting: Internal Medicine

## 2019-06-27 ENCOUNTER — Other Ambulatory Visit: Payer: Self-pay

## 2019-06-27 VITALS — BP 158/95 | HR 95 | Ht 67.0 in | Wt 181.2 lb

## 2019-06-27 DIAGNOSIS — E669 Obesity, unspecified: Secondary | ICD-10-CM | POA: Diagnosis not present

## 2019-06-27 DIAGNOSIS — I10 Essential (primary) hypertension: Secondary | ICD-10-CM | POA: Diagnosis not present

## 2019-06-27 DIAGNOSIS — R000 Tachycardia, unspecified: Secondary | ICD-10-CM

## 2019-06-27 DIAGNOSIS — I471 Supraventricular tachycardia: Secondary | ICD-10-CM

## 2019-06-27 DIAGNOSIS — S060X0A Concussion without loss of consciousness, initial encounter: Secondary | ICD-10-CM | POA: Insufficient documentation

## 2019-06-27 DIAGNOSIS — I341 Nonrheumatic mitral (valve) prolapse: Secondary | ICD-10-CM

## 2019-06-27 DIAGNOSIS — I498 Other specified cardiac arrhythmias: Secondary | ICD-10-CM | POA: Diagnosis not present

## 2019-06-27 MED ORDER — METOPROLOL SUCCINATE ER 25 MG PO TB24: 25.0000 mg | ORAL_TABLET | Freq: Every day | ORAL | 3 refills | Status: DC

## 2019-06-27 NOTE — Assessment & Plan Note (Signed)
Patient has been advised to lose weight. 

## 2019-06-27 NOTE — Assessment & Plan Note (Signed)
Chronic problem with intermittent tachycardia

## 2019-06-27 NOTE — Assessment & Plan Note (Signed)
On Metroprolol 25 mg p.o. daily

## 2019-06-27 NOTE — Assessment & Plan Note (Addendum)
-   Today, the patient's blood pressure is well managed on betablockers. - The patient will continue the current treatment regimen.  - I encouraged the patient to eat a low-sodium diet to help control blood pressure. - I encouraged the patient to live an active lifestyle and complete activities that increases heart rate to 85% target heart rate at least 5 times per week for one hour.     

## 2019-06-29 ENCOUNTER — Other Ambulatory Visit: Payer: Self-pay

## 2019-06-29 ENCOUNTER — Encounter: Payer: Self-pay | Admitting: Emergency Medicine

## 2019-06-29 ENCOUNTER — Emergency Department
Admission: EM | Admit: 2019-06-29 | Discharge: 2019-06-29 | Disposition: A | Discharge status: Home / Self Care | Payer: BC Managed Care – PPO | Attending: Emergency Medicine | Admitting: Emergency Medicine

## 2019-06-29 ENCOUNTER — Emergency Department: Payer: BC Managed Care – PPO

## 2019-06-29 DIAGNOSIS — Z79899 Other long term (current) drug therapy: Secondary | ICD-10-CM | POA: Diagnosis not present

## 2019-06-29 DIAGNOSIS — I10 Essential (primary) hypertension: Secondary | ICD-10-CM | POA: Diagnosis not present

## 2019-06-29 DIAGNOSIS — R0789 Other chest pain: Secondary | ICD-10-CM | POA: Diagnosis present

## 2019-06-29 DIAGNOSIS — F17210 Nicotine dependence, cigarettes, uncomplicated: Secondary | ICD-10-CM | POA: Diagnosis not present

## 2019-06-29 LAB — CBC
HCT: 38.1 % (ref 36.0–46.0)
Hemoglobin: 13.1 g/dL (ref 12.0–15.0)
MCH: 27.6 pg (ref 26.0–34.0)
MCHC: 34.4 g/dL (ref 30.0–36.0)
MCV: 80.2 fL (ref 80.0–100.0)
Platelets: 296 10*3/uL (ref 150–400)
RBC: 4.75 MIL/uL (ref 3.87–5.11)
RDW: 12.7 % (ref 11.5–15.5)
WBC: 4.7 10*3/uL (ref 4.0–10.5)
nRBC: 0 % (ref 0.0–0.2)

## 2019-06-29 LAB — BASIC METABOLIC PANEL
Anion gap: 10 (ref 5–15)
BUN: 12 mg/dL (ref 6–20)
CO2: 22 mmol/L (ref 22–32)
Calcium: 9.2 mg/dL (ref 8.9–10.3)
Chloride: 104 mmol/L (ref 98–111)
Creatinine, Ser: 0.63 mg/dL (ref 0.44–1.00)
GFR calc Af Amer: >60 mL/min (ref >60)
GFR calc non Af Amer: >60 mL/min (ref >60)
Glucose, Bld: 109 mg/dL — ABNORMAL HIGH (ref 70–99)
Potassium: 3.8 mmol/L (ref 3.5–5.1)
Sodium: 136 mmol/L (ref 135–145)

## 2019-06-29 LAB — TROPONIN I (HIGH SENSITIVITY)
Troponin I (High Sensitivity): 3 ng/L (ref <18)
Troponin I (High Sensitivity): 3 ng/L (ref <18)

## 2019-06-29 NOTE — ED Notes (Signed)
Sent a rainbow to lab  ° °

## 2019-06-29 NOTE — ED Triage Notes (Signed)
Patient presents to the ED via EMS with stabbing, crushing pain and pressure.  Patient is complaining of left sided chest pain, neck pain and states it radiates to her right shoulder.  Patient has history of SVT and HTN.  Patient was given 1 spray of nitro and pain decreased somewhat.

## 2019-06-29 NOTE — ED Provider Notes (Signed)
[facility] Emergency Department Provider Note ____________________________________________   First MD Initiated Contact with Patient 06/29/19 1502     (approximate)  I have reviewed the triage vital signs and the nursing notes.   HISTORY  Chief Complaint Chest Pain    HPI April Guzman is a 44 y.o. female with PMH as noted below including SVT and hypertension who presents with elevated blood pressure readings at home this morning as high as the 180s-190s over 100-120.  The patient reports some associated palpitations and an episode of chest pain earlier today which started in the mid chest and radiated out to her shoulders.  It has now resolved.  The patient states that she previously was on antihypertensives, lost weight, and then they were discontinued.  She states that she recently gained weight during the Covid pandemic and was restarted on metoprolol 3 days ago.  The patient also reports some palpitations and intermittent tingling in her left arm ever since she received the [employer] COVID-19 vaccination 2 months ago.  Past Medical History:  Diagnosis Date  . Anemia   . Anxiety   . Depression   . Dysrhythmia    SVT, unsuccessful ablation  . Family history of adverse reaction to anesthesia    Mother hard to wake up  . Generalized anxiety disorder   . GERD (gastroesophageal reflux disease)   . Headache(784.0)   . Heart murmur   . Hypertension   . Hypoglycemia 12/11/10   "I've changed my diet to deal w/it and it works"  . Pars defect   . 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 Vitamin D deficiency     Patient Active Problem List   Diagnosis Date Noted  . Essential hypertension 06/27/2019  . Concussion with no loss of consciousness 06/27/2019  . Obesity (BMI 30-39.9) 06/27/2019  . Pelvic pain 09/03/2017  . Menorrhagia 09/03/2017  . Neck fullness 12/17/2010  . Acidemia-bicarbonate 18 12/17/2010  .  Hyperkalemia 12/17/2010  . Blood pressure elevated 10/10/2010  . Supraventricular tachycardia  07/01/2010  . Back pain-disabled 07/01/2010  . Mitral valve prolapse 07/01/2010    Past Surgical History:  Procedure Laterality Date  . CARDIAC ELECTROPHYSIOLOGY MAPPING AND ABLATION  12/11/10  . CESAREAN SECTION  2004; 2008  . DILATION AND CURETTAGE OF UTERUS    . LAPAROSCOPIC BILATERAL SALPINGECTOMY Bilateral 09/03/2017   Procedure: LAPAROSCOPIC BILATERAL SALPINGECTOMY;  Surgeon: Ward, Honor Loh, MD;  Location: [facility] ORS;  Service: Gynecology;  Laterality: Bilateral;  . LAPAROSCOPIC HYSTERECTOMY N/A 09/03/2017   Procedure: HYSTERECTOMY TOTAL LAPAROSCOPIC;  Surgeon: Ward, Honor Loh, MD;  Location: [facility] ORS;  Service: Gynecology;  Laterality: N/A;  . SUPRAVENTRICULAR TACHYCARDIA ABLATION N/A 12/11/2010   Procedure: SUPRAVENTRICULAR TACHYCARDIA ABLATION;  Surgeon: Evans Lance, MD;  Location: [facility] CATH LAB;  Service: Cardiovascular;  Laterality: N/A;  . TUBAL LIGATION      Prior to Admission medications   Medication Sig Start Date End Date Taking? Authorizing Provider  acetaminophen (TYLENOL) 500 MG tablet Take 1,000 mg by mouth 2 (two) times daily as needed for moderate pain or headache.    [provider]  CRANBERRY PO Take 1 tablet by mouth every evening. Patient not taking: Reported on 06/27/2019    [provider]  ferrous sulfate 325 (65 FE) MG EC tablet Take 650 mg by mouth every evening.  Patient not taking: Reported on 06/27/2019    [provider]  ibuprofen (ADVIL,MOTRIN) 800 MG  tablet Take 1 tablet (800 mg total) by mouth every 6 (six) hours. 09/03/17   Ward, Honor Loh, MD  metoprolol succinate (TOPROL-XL) 25 MG 24 hr tablet Take 1 tablet (25 mg total) by mouth daily. 06/27/19   Cletis Athens, MD  omeprazole (PRILOSEC) 20 MG capsule Take 20 mg by mouth 2 (two) times daily.     [provider]  Vitamin D, Ergocalciferol, (DRISDOL) 50000 units CAPS  capsule Take 50,000 Units by mouth every Saturday.  04/24/16   [provider]    Allergies Cinnamon, Morphine, Latex, and Macrobid [nitrofurantoin monohydrate macrocrystals]  Family History  Problem Relation Age of Onset  . Breast cancer Mother 4    Social History Social History   Tobacco Use  . Smoking status: Current Every Day Smoker    Packs/day: 0.00    Years: 0.00    Pack years: 0.00  . Smokeless tobacco: Never Used  . Tobacco comment: many years ago  Vaping Use  . Vaping Use: Never used  Substance Use Topics  . Alcohol use: No  . Drug use: No    Review of Systems  Constitutional: No fever/chills. Eyes: No visual changes. ENT: No sore throat. Cardiovascular: Positive for resolved chest pain. Respiratory: Denies shortness of breath. Gastrointestinal: No vomiting or diarrhea.  Genitourinary: Negative for dysuria.  Musculoskeletal: Negative for back pain. Skin: Negative for rash. Neurological: Negative for headache.   ____________________________________________   PHYSICAL EXAM:  VITAL SIGNS: ED Triage Vitals  Enc Vitals Group     BP 06/29/19 0929 140/84     Pulse Rate 06/29/19 0929 69     Resp 06/29/19 1241 16     Temp 06/29/19 0929 98.5 F (36.9 C)     Temp Source 06/29/19 0929 Oral     SpO2 06/29/19 0929 100 %     Weight --      Height --      Head Circumference --      Peak Flow --      Pain Score 06/29/19 1444 7     Pain Loc --      Pain Edu? --      Excl. in [place]? --     Constitutional: Alert and oriented. Well appearing and in no acute distress. Eyes: Conjunctivae are normal.  Head: Atraumatic. Nose: No congestion/rhinnorhea. Mouth/Throat: Mucous membranes are moist.   Neck: Normal range of motion.  Cardiovascular: Normal rate, regular rhythm. Grossly normal heart sounds.  Good peripheral circulation. Respiratory: Normal respiratory effort.  No retractions. Lungs CTAB. Gastrointestinal: No distention.  Musculoskeletal: No  lower extremity edema.  No calf or popliteal swelling or tenderness.  Extremities warm and well perfused.  Neurologic:  Normal speech and language. No gross focal neurologic deficits are appreciated.  Skin:  Skin is warm and dry. No rash noted. Psychiatric: Mood and affect are normal. Speech and behavior are normal.  ____________________________________________   LABS (all labs ordered are listed, but only abnormal results are displayed)  Labs Reviewed  BASIC METABOLIC PANEL - Abnormal; Notable for the following components:      Result Value   Glucose, Bld 109 (*)    All other components within normal limits  CBC  POC URINE PREG, ED  TROPONIN I (HIGH SENSITIVITY)  TROPONIN I (HIGH SENSITIVITY)   ____________________________________________  EKG  ED ECG REPORT I, Arta Silence, the attending physician, personally viewed and interpreted this ECG.  Date: 06/29/2019 EKG Time: 0924 Rate: 72 Rhythm: normal sinus rhythm QRS Axis: normal  Intervals: normal ST/T Wave abnormalities: normal Narrative Interpretation: no evidence of acute ischemia  ____________________________________________  RADIOLOGY  CXR: No focal infiltrate or edema  ____________________________________________   PROCEDURES  Procedure(s) performed: No  Procedures  Critical Care performed: No ____________________________________________   INITIAL IMPRESSION / ASSESSMENT AND PLAN / ED COURSE  Pertinent labs & imaging results that were available during my care of the patient were reviewed by me and considered in my medical decision making (see chart for details).  44 year old female with a history of SVT, hypertension, no other PMH as noted above presents primarily for elevated blood pressure readings and an episode of chest pain this morning which has subsequently resolved.  She was recently restarted on metoprolol after being off it for a few years.  She has also had some on and off palpitations  and arm paresthesias ever since getting a COVID-19 vaccination 2 months ago.  These are unchanged today.  On exam, the patient is very well-appearing.  Her vital signs are normal except for mild hypertension as high as 165/85.  The physical exam is otherwise unremarkable.  Lab work-up was obtained from triage including troponins x2.  Labs are all within normal limits.  The troponins are negative.  Chest x-ray shows no acute findings.  The patient has had no further chest pain while in the ED.  I suspect that the patient is still having some blood pressure fluctuation in symptoms related to recently starting the metoprolol.  At this time, there is no evidence of hypertensive urgency or any end organ dysfunction.  There is no indication for acute treatment.  Given that she has only been back on metoprolol for 3 days, I do not recommend changing the dose or adding a second agent until it has stabilized.  Given the negative troponins, the patient has ruled out for ACS.  There is no clinical evidence for ACS and she has no history of CAD.  There is also no clinical evidence for PE, and the patient rules out by [facility].  At this time, the patient is stable for discharge home.  I counseled her on the results of the work-up.  She feels comfortable and would like to go home.  I recommended that she continue the metoprolol, keep a log of her blood pressure, and follow-up with her cardiologist as scheduled.  Return precautions given, and she expressed understanding. ____________________________________________   FINAL CLINICAL IMPRESSION(S) / ED DIAGNOSES  Final diagnoses:  Atypical chest pain  Hypertension, unspecified type      NEW MEDICATIONS STARTED DURING THIS VISIT:  Discharge Medication List as of 06/29/2019  3:41 PM       Note:  This document was prepared using Dragon voice recognition software and may include unintentional dictation errors.   Arta Silence, MD 06/29/19 [phone]

## 2019-06-29 NOTE — ED Notes (Signed)
ED Provider at bedside. 

## 2019-06-29 NOTE — Discharge Instructions (Addendum)
Continue take your metoprolol as prescribed.  Follow-up with your cardiologist as scheduled.  Keep a log of your blood pressures at home.  Return to the ER for new, worsening, or persistent severe chest pain or shortness of breath, weakness or lightheadedness, leg swelling, palpitations, or persistent elevated blood pressure especially higher than 175-301 systolic or higher than 040 diastolic.

## 2019-06-29 NOTE — ED Notes (Signed)
Pt updated in the WR, VS reassessed. 

## 2019-07-04 NOTE — Assessment & Plan Note (Signed)
Neuro  Exam is neg/ no focal sign

## 2019-09-27 ENCOUNTER — Other Ambulatory Visit: Payer: Self-pay

## 2019-09-27 ENCOUNTER — Ambulatory Visit (INDEPENDENT_AMBULATORY_CARE_PROVIDER_SITE_OTHER): Payer: BC Managed Care – PPO | Admitting: Internal Medicine

## 2019-09-27 ENCOUNTER — Encounter: Payer: Self-pay | Admitting: Internal Medicine

## 2019-09-27 VITALS — BP 169/98 | HR 86 | Ht 65.0 in | Wt 179.6 lb

## 2019-09-27 DIAGNOSIS — F419 Anxiety disorder, unspecified: Secondary | ICD-10-CM | POA: Insufficient documentation

## 2019-09-27 DIAGNOSIS — F411 Generalized anxiety disorder: Secondary | ICD-10-CM

## 2019-09-27 DIAGNOSIS — I471 Supraventricular tachycardia: Secondary | ICD-10-CM

## 2019-09-27 DIAGNOSIS — I498 Other specified cardiac arrhythmias: Secondary | ICD-10-CM | POA: Insufficient documentation

## 2019-09-27 DIAGNOSIS — I10 Essential (primary) hypertension: Secondary | ICD-10-CM

## 2019-09-27 DIAGNOSIS — R002 Palpitations: Secondary | ICD-10-CM

## 2019-09-27 DIAGNOSIS — R072 Precordial pain: Secondary | ICD-10-CM

## 2019-09-27 DIAGNOSIS — K219 Gastro-esophageal reflux disease without esophagitis: Secondary | ICD-10-CM

## 2019-09-27 HISTORY — DX: Other specified cardiac arrhythmias: I49.8

## 2019-09-27 NOTE — Assessment & Plan Note (Signed)
-   Today, the patient's blood pressure is not well managed on metoprolol. - The patient will change the current treatment regimen to Metoprolol 25 mg BID.  - I encouraged the patient to eat a low-sodium diet to help control blood pressure. - I encouraged the patient to live an active lifestyle and complete activities that increases heart rate to 85% target heart rate at least 5 times per week for one hour.

## 2019-09-27 NOTE — Assessment & Plan Note (Signed)
Pt has a hx of supraventricular tachycardia and ablation

## 2019-09-27 NOTE — Assessment & Plan Note (Signed)
-   Patient experiencing high levels of anxiety.  - Encouraged patient to engage in relaxing activities like yoga, meditation, journaling, going for a walk, or participating in a hobby.  - Encouraged patient to reach out to trusted friends or family members about recent struggles 

## 2019-09-27 NOTE — Assessment & Plan Note (Signed)
Pt described chest pain to be in the middle of the chest. It comes while walking up stairs. It is retrosternal without nausea or vomiting. ECG does not show any acute changes. We'll do an echo and a stress test to evaluate her symptoms. Pt has a hx of ablation in the past. She also complained of tachycardia, so I increased her metoprolol to 25 mg BID

## 2019-09-27 NOTE — Assessment & Plan Note (Signed)
Pt was advised to take prilosec 20 mg before each meal in the evening

## 2019-09-27 NOTE — Progress Notes (Signed)
Established Patient Office Visit  SUBJECTIVE:  Subjective  Patient ID: April Guzman, female    DOB: 01/06/1976  Age: 44 y.o. MRN: [id number]  CC:  Chief Complaint  Patient presents with  . Palpitations  . Hypertension    patient states that blood pressure has been really elevated   . 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 female presenting today for an evaluation of her anxiety with increased blood pressure and palpitations.   Palpitations  This is a new problem. The problem occurs 2 to 4 times per day. The problem has been waxing and waning. On average, each episode lasts 2 minutes. The symptoms are aggravated by stress. Associated symptoms include anxiety, chest pain and shortness of breath (on heavy exertion). Pertinent negatives include no coughing or syncope. Risk factors include stress. Her past medical history is significant for anxiety.  Anxiety Presents for initial visit. Symptoms include chest pain, depressed mood, excessive worry, insomnia, nervous/anxious behavior, palpitations and shortness of breath (on heavy exertion). Patient reports no suicidal ideas. Symptoms occur most days. The severity of symptoms is causing significant distress. The symptoms are aggravated by family issues and work stress.   Risk factors include a major life event (child recently attempted suicide). Her past medical history is significant for anxiety/panic attacks and depression.  Chest Pain  This is a new problem. The problem occurs intermittently. The problem has been waxing and waning. The pain is present in the lateral region. The pain radiates to the right shoulder and mid back. Associated symptoms include headaches, palpitations and shortness of breath (on heavy exertion). Pertinent negatives include no cough, lower extremity edema or syncope. Risk factors include stress.  Her past medical history is significant for  anxiety/panic attacks and hypertension.  Her family medical history is significant for hypertension (dad, mom, GM) and early MI. Prior workup: wil order stress test and echo.      Past Medical History:  Diagnosis Date  . Anemia   . Anxiety   . Depression   . Dysrhythmia    SVT, unsuccessful ablation  . Family history of adverse reaction to anesthesia    Mother hard to wake up  . Generalized anxiety disorder   . GERD (gastroesophageal reflux disease)   . Headache(784.0)   . Heart murmur   . Hypertension   . Hypoglycemia 12/11/10   "I've changed my diet to deal w/it and it works"  . Pars defect   . 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 Vitamin D deficiency     Past Surgical History:  Procedure Laterality Date  . CARDIAC ELECTROPHYSIOLOGY MAPPING AND ABLATION  12/11/10  . CESAREAN SECTION  2004; 2008  . DILATION AND CURETTAGE OF UTERUS    . LAPAROSCOPIC BILATERAL SALPINGECTOMY Bilateral 09/03/2017   Procedure: LAPAROSCOPIC BILATERAL SALPINGECTOMY;  Surgeon: Ward, Honor Loh, MD;  Location: [facility] ORS;  Service: Gynecology;  Laterality: Bilateral;  . LAPAROSCOPIC HYSTERECTOMY N/A 09/03/2017   Procedure: HYSTERECTOMY TOTAL LAPAROSCOPIC;  Surgeon: Ward, Honor Loh, MD;  Location: [facility] ORS;  Service: Gynecology;  Laterality: N/A;  . SUPRAVENTRICULAR TACHYCARDIA ABLATION N/A 12/11/2010   Procedure: SUPRAVENTRICULAR TACHYCARDIA ABLATION;  Surgeon: Evans Lance, MD;  Location: [facility] CATH LAB;  Service: Cardiovascular;  Laterality: N/A;  . TUBAL LIGATION      Family History  Problem Relation Age  of Onset  . Breast cancer Mother 76    Social History   Socioeconomic History  . Marital status: Married    Spouse name: Not on file  . Number of children: Not on file  . Years of education: Not on file  . Highest education level: Not on file  Occupational History  . Not on file  Tobacco Use  . Smoking status: Current Every Day Smoker    Packs/day: 0.00     Years: 0.00    Pack years: 0.00  . Smokeless tobacco: Never Used  . Tobacco comment: many years ago  Vaping Use  . Vaping Use: Never used  Substance and Sexual Activity  . Alcohol use: No  . Drug use: No  . Sexual activity: Yes  Other Topics Concern  . Not on file  Social History Narrative  . Not on file   Social Determinants of Health   Financial Resource Strain:   . Difficulty of Paying Living Expenses: Not on file  Food Insecurity:   . Worried About Charity fundraiser in the Last Year: Not on file  . Ran Out of Food in the Last Year: Not on file  Transportation Needs:   . Lack of Transportation (Medical): Not on file  . Lack of Transportation (Non-Medical): Not on file  Physical Activity:   . Days of Exercise per Week: Not on file  . Minutes of Exercise per Session: Not on file  Stress:   . Feeling of Stress : Not on file  Social Connections:   . Frequency of Communication with Friends and Family: Not on file  . Frequency of Social Gatherings with Friends and Family: Not on file  . Attends Religious Services: Not on file  . Active Member of Clubs or Organizations: Not on file  . Attends Archivist Meetings: Not on file  . Marital Status: Not on file  Intimate Partner Violence:   . Fear of Current or Ex-Partner: Not on file  . Emotionally Abused: Not on file  . Physically Abused: Not on file  . Sexually Abused: Not on file     Current Outpatient Medications:  .  acetaminophen (TYLENOL) 500 MG tablet, Take 1,000 mg by mouth 2 (two) times daily as needed for moderate pain or headache., Disp: , Rfl:  .  CRANBERRY PO, Take 1 tablet by mouth every evening. , Disp: , Rfl:  .  ferrous sulfate 325 (65 FE) MG EC tablet, Take 650 mg by mouth every evening. , Disp: , Rfl:  .  ibuprofen (ADVIL,MOTRIN) 800 MG tablet, Take 1 tablet (800 mg total) by mouth every 6 (six) hours., Disp: 45 tablet, Rfl: 0 .  metoprolol succinate (TOPROL-XL) 25 MG 24 hr tablet, Take 1  tablet (25 mg total) by mouth daily., Disp: 90 tablet, Rfl: 3 .  omeprazole (PRILOSEC) 20 MG capsule, Take 20 mg by mouth 2 (two) times daily. , Disp: , Rfl:  .  Vitamin D, Ergocalciferol, (DRISDOL) 50000 units CAPS capsule, Take 50,000 Units by mouth every Saturday. , Disp: , Rfl:    Allergies  Allergen Reactions  . Cinnamon Swelling    Throat swelling  . Morphine Hives, Itching, Nausea Only and Rash  . Latex Itching  . Macrobid [Nitrofurantoin Monohydrate Macrocrystals] Nausea Only and Rash    ROS Review of Systems  Constitutional: Negative.   HENT: Negative.   Eyes: Negative.   Respiratory: Positive for shortness of breath (on heavy exertion). Negative for cough.  Cardiovascular: Positive for chest pain and palpitations. Negative for syncope.  Gastrointestinal: Negative.   Endocrine: Negative.   Genitourinary: Negative.   Musculoskeletal: Negative.   Skin: Negative.   Allergic/Immunologic: Negative.   Neurological: Positive for headaches.  Hematological: Negative.   Psychiatric/Behavioral: Negative for suicidal ideas. The patient is nervous/anxious and has insomnia.   All other systems reviewed and are negative.    OBJECTIVE:    Physical Exam Vitals reviewed.  Constitutional:      Appearance: Normal appearance.  HENT:     Mouth/Throat:     Mouth: Mucous membranes are moist.  Eyes:     Pupils: Pupils are equal, round, and reactive to light.  Neck:     Vascular: No carotid bruit.  Cardiovascular:     Rate and Rhythm: Normal rate and regular rhythm.     Pulses: Normal pulses.     Heart sounds: Normal heart sounds.  Pulmonary:     Effort: Pulmonary effort is normal.     Breath sounds: Normal breath sounds.  Abdominal:     General: Bowel sounds are normal.     Palpations: Abdomen is soft. There is no hepatomegaly, splenomegaly or mass.     Tenderness: There is no abdominal tenderness.     Hernia: No hernia is present.  Musculoskeletal:        General: No  tenderness.     Cervical back: Neck supple.     Right lower leg: No edema.     Left lower leg: No edema.  Skin:    Findings: No rash.  Neurological:     Mental Status: She is alert and oriented to person, place, and time.     Motor: No weakness.  Psychiatric:        Mood and Affect: Mood and affect normal.        Behavior: Behavior normal.     BP (!) 169/98   Pulse 86   Ht 5\' 5"  (1.651 m)   Wt 179 lb 9.6 oz (81.5 kg)   LMP 09/03/2016 (Approximate)   BMI 29.89 kg/m  Wt Readings from Last 3 Encounters:  09/27/19 179 lb 9.6 oz (81.5 kg)  06/27/19 181 lb 3.2 oz (82.2 kg)  09/26/17 159 lb (72.1 kg)    Health Maintenance Due  Topic Date Due  . Hepatitis C Screening  Never done  . COVID-19 Vaccine (1) Never done  . HIV Screening  Never done  . PAP SMEAR-Modifier  Never done  . INFLUENZA VACCINE  08/13/2019    There are no preventive care reminders to display for this patient.  CBC Latest Ref Rng & Units 06/29/2019 09/26/2017 08/26/2017  WBC 4.0 - 10.5 K/uL 4.7 8.3 5.2  Hemoglobin 12.0 - 15.0 g/dL 13.1 14.0 13.1  Hematocrit 36 - 46 % 38.1 40.2 37.9  Platelets 150 - 400 K/uL 296 378 325   CMP Latest Ref Rng & Units 06/29/2019 09/26/2017 08/26/2017  Glucose 70 - 99 mg/dL 109(H) 108(H) 102(H)  BUN 6 - 20 mg/dL 12 18 14   Creatinine 0.44 - 1.00 mg/dL 0.63 0.63 0.54  Sodium 135 - 145 mmol/L 136 136 135  Potassium 3.5 - 5.1 mmol/L 3.8 3.6 3.8  Chloride 98 - 111 mmol/L 104 101 106  CO2 22 - 32 mmol/L 22 25 22   Calcium 8.9 - 10.3 mg/dL 9.2 9.3 9.1  Total Protein 6.4 - 8.2 g/dL - - -  Total Bilirubin 0.2 - 1.0 mg/dL - - -  Alkaline Phos 50 - 136  Unit/L - - -  AST 15 - 37 Unit/L - - -  ALT 12 - 78 U/L - - -    No results found for: TSH Lab Results  Component Value Date   ALBUMIN 3.9 06/24/2012   ANIONGAP 10 06/29/2019   No results found for: CHOL, HDL, LDLCALC, CHOLHDL No results found for: TRIG No results found for: HGBA1C    ASSESSMENT & PLAN:   Problem List Items  Addressed This Visit      Cardiovascular and Mediastinum   Supraventricular tachycardia     Pt has a hx of supraventricular tachycardia and ablation       Essential hypertension    - Today, the patient's blood pressure is not well managed on metoprolol. - The patient will change the current treatment regimen to Metoprolol 25 mg BID.  - I encouraged the patient to eat a low-sodium diet to help control blood pressure. - I encouraged the patient to live an active lifestyle and complete activities that increases heart rate to 85% target heart rate at least 5 times per week for one hour.         Digestive   GERD (gastroesophageal reflux disease)    Pt was advised to take prilosec 20 mg before each meal in the evening        Other   Generalized anxiety disorder    - Patient experiencing high levels of anxiety.  - Encouraged patient to engage in relaxing activities like yoga, meditation, journaling, going for a walk, or participating in a hobby.  - Encouraged patient to reach out to trusted friends or family members about recent struggles       Palpitations - Primary   Relevant Orders   EKG 12-Lead   Precordial pain    Pt described chest pain to be in the middle of the chest. It comes while walking up stairs. It is retrosternal without nausea or vomiting. ECG does not show any acute changes. We'll do an echo and a stress test to evaluate her symptoms. Pt has a hx of ablation in the past. She also complained of tachycardia, so I increased her metoprolol to 25 mg BID         No orders of the defined types were placed in this encounter.   EKG: normal sinus rhythm, nonspecific ST and T waves changes.   Follow-up: No follow-ups on file.    Cletis Athens, MD [facility] [address] [place], [place] [address]   By signing my name below, I, [employer], attest that this documentation has been prepared under the direction and in the presence of Dr. Cletis Athens Electronically Signed: Cletis Athens, MD 09/27/19, 9:46 AM  I personally performed the services described in this documentation, which was SCRIBED in my presence. The recorded information has been reviewed and considered accurate. It has been edited as necessary during review. Cletis Athens, MD

## 2019-10-11 ENCOUNTER — Ambulatory Visit (INDEPENDENT_AMBULATORY_CARE_PROVIDER_SITE_OTHER): Payer: BC Managed Care – PPO | Admitting: Internal Medicine

## 2019-10-11 ENCOUNTER — Other Ambulatory Visit: Payer: Self-pay

## 2019-10-11 ENCOUNTER — Encounter: Payer: Self-pay | Admitting: Internal Medicine

## 2019-10-11 VITALS — BP 160/80 | HR 59 | Ht 65.0 in | Wt 180.6 lb

## 2019-10-11 DIAGNOSIS — I10 Essential (primary) hypertension: Secondary | ICD-10-CM | POA: Diagnosis not present

## 2019-10-11 DIAGNOSIS — I498 Other specified cardiac arrhythmias: Secondary | ICD-10-CM

## 2019-10-11 DIAGNOSIS — I471 Supraventricular tachycardia: Secondary | ICD-10-CM

## 2019-10-11 DIAGNOSIS — I341 Nonrheumatic mitral (valve) prolapse: Secondary | ICD-10-CM

## 2019-10-11 DIAGNOSIS — K219 Gastro-esophageal reflux disease without esophagitis: Secondary | ICD-10-CM | POA: Diagnosis not present

## 2019-10-11 DIAGNOSIS — F411 Generalized anxiety disorder: Secondary | ICD-10-CM

## 2019-10-11 MED ORDER — LISINOPRIL-HYDROCHLOROTHIAZIDE 20-12.5 MG PO TABS: 1.0000 | ORAL_TABLET | Freq: Every day | ORAL | 3 refills | Status: AC

## 2019-10-11 MED ORDER — METOPROLOL SUCCINATE ER 25 MG PO TB24: 25.0000 mg | ORAL_TABLET | Freq: Every day | ORAL | 3 refills | Status: AC

## 2019-10-11 NOTE — Assessment & Plan Note (Signed)
There is no symptom of worsening of mitral valve prolapse no increase in the murmur.  She denies any chest pain chest is clear heart is regular.

## 2019-10-11 NOTE — Progress Notes (Signed)
Established Patient Office Visit  SUBJECTIVE:  Subjective  Patient ID: April Guzman, female    DOB: 1975/05/30  Age: 44 y.o. MRN: [id number]  CC:  Chief Complaint  Patient presents with  . Hypertension    Patient has been taking metoprolol fro the past 3 months, her dosage was increased 2 weeks ago and she is still having elevated blood presuure as well as complaints about her hands swelling.     HPI April Guzman is a 44 y.o. female presenting today for a hypertension check.  She has been having issues with her blood pressure for three months now. Her dosage was increased two weeks ago, but her blood pressure continues to be elevated (183/97 today). She notes that she   She states that she recently had a friend pass away from [place] that was her age; the friend was not vaccinated. Adrea is vaccinated from [place].   She got the [place] vaccine in April 2021.    Past Medical History:  Diagnosis Date  . Anemia   . Anxiety   . Depression   . Dysrhythmia    SVT, unsuccessful ablation  . Family history of adverse reaction to anesthesia    Mother hard to wake up  . Generalized anxiety disorder   . GERD (gastroesophageal reflux disease)   . Headache(784.0)   . Heart murmur   . Hypertension   . Hypoglycemia 12/11/10   "I've changed my diet to deal w/it and it works"  . Pars defect   . 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 Vitamin D deficiency     Past Surgical History:  Procedure Laterality Date  . CARDIAC ELECTROPHYSIOLOGY MAPPING AND ABLATION  12/11/10  . CESAREAN SECTION  2004; 2008  . DILATION AND CURETTAGE OF UTERUS    . LAPAROSCOPIC BILATERAL SALPINGECTOMY Bilateral 09/03/2017   Procedure: LAPAROSCOPIC BILATERAL SALPINGECTOMY;  Surgeon: Ward, Honor Loh, MD;  Location: [facility] ORS;  Service: Gynecology;  Laterality: Bilateral;  . LAPAROSCOPIC HYSTERECTOMY N/A 09/03/2017   Procedure: HYSTERECTOMY TOTAL LAPAROSCOPIC;   Surgeon: Ward, Honor Loh, MD;  Location: [facility] ORS;  Service: Gynecology;  Laterality: N/A;  . SUPRAVENTRICULAR TACHYCARDIA ABLATION N/A 12/11/2010   Procedure: SUPRAVENTRICULAR TACHYCARDIA ABLATION;  Surgeon: Evans Lance, MD;  Location: [facility] CATH LAB;  Service: Cardiovascular;  Laterality: N/A;  . TUBAL LIGATION      Family History  Problem Relation Age of Onset  . Breast cancer Mother 25    Social History   Socioeconomic History  . Marital status: Married    Spouse name: Not on file  . Number of children: Not on file  . Years of education: Not on file  . Highest education level: Not on file  Occupational History  . Not on file  Tobacco Use  . Smoking status: Current Every Day Smoker    Packs/day: 0.00    Years: 0.00    Pack years: 0.00  . Smokeless tobacco: Never Used  . Tobacco comment: many years ago  Vaping Use  . Vaping Use: Never used  Substance and Sexual Activity  . Alcohol use: No  . Drug use: No  . Sexual activity: Yes  Other Topics Concern  . Not on file  Social History Narrative  . Not on file   Social Determinants of Health   Financial Resource Strain:   . Difficulty of Paying Living Expenses: Not on file  Food Insecurity:   . Worried About  Running Out of Food in the Last Year: Not on file  . Ran Out of Food in the Last Year: Not on file  Transportation Needs:   . Lack of Transportation (Medical): Not on file  . Lack of Transportation (Non-Medical): Not on file  Physical Activity:   . Days of Exercise per Week: Not on file  . Minutes of Exercise per Session: Not on file  Stress:   . Feeling of Stress : Not on file  Social Connections:   . Frequency of Communication with Friends and Family: Not on file  . Frequency of Social Gatherings with Friends and Family: Not on file  . Attends Religious Services: Not on file  . Active Member of Clubs or Organizations: Not on file  . Attends Archivist Meetings: Not on file  . Marital Status: Not  on file  Intimate Partner Violence:   . Fear of Current or Ex-Partner: Not on file  . Emotionally Abused: Not on file  . Physically Abused: Not on file  . Sexually Abused: Not on file     Current Outpatient Medications:  .  acetaminophen (TYLENOL) 500 MG tablet, Take 1,000 mg by mouth 2 (two) times daily as needed for moderate pain or headache., Disp: , Rfl:  .  CRANBERRY PO, Take 1 tablet by mouth every evening. , Disp: , Rfl:  .  ferrous sulfate 325 (65 FE) MG EC tablet, Take 650 mg by mouth every evening. , Disp: , Rfl:  .  ibuprofen (ADVIL,MOTRIN) 800 MG tablet, Take 1 tablet (800 mg total) by mouth every 6 (six) hours., Disp: 45 tablet, Rfl: 0 .  lisinopril-hydrochlorothiazide (ZESTORETIC) 20-12.5 MG tablet, Take 1 tablet by mouth daily., Disp: 90 tablet, Rfl: 3 .  metoprolol succinate (TOPROL-XL) 25 MG 24 hr tablet, Take 1 tablet (25 mg total) by mouth daily., Disp: 90 tablet, Rfl: 3 .  omeprazole (PRILOSEC) 20 MG capsule, Take 20 mg by mouth 2 (two) times daily. , Disp: , Rfl:  .  Vitamin D, Ergocalciferol, (DRISDOL) 50000 units CAPS capsule, Take 50,000 Units by mouth every Saturday. , Disp: , Rfl:    Allergies  Allergen Reactions  . Cinnamon Swelling    Throat swelling  . Morphine Hives, Itching, Nausea Only and Rash  . Latex Itching  . Macrobid [Nitrofurantoin Monohydrate Macrocrystals] Nausea Only and Rash    ROS Review of Systems  Eyes: Positive for visual disturbance.  Respiratory: Negative for cough, chest tightness and shortness of breath.   Cardiovascular: Positive for leg swelling. Negative for chest pain.  Genitourinary: Positive for pelvic pain and urgency (decreased). Negative for dysuria.  Neurological: Positive for light-headedness. Negative for syncope.  Psychiatric/Behavioral: The patient is nervous/anxious.      OBJECTIVE:    Physical Exam Vitals reviewed.  Constitutional:      Appearance: Normal appearance.  HENT:     Mouth/Throat:     Mouth:  Mucous membranes are moist.  Eyes:     Pupils: Pupils are equal, round, and reactive to light.  Neck:     Vascular: No carotid bruit.  Cardiovascular:     Rate and Rhythm: Normal rate and regular rhythm.     Pulses: Normal pulses.     Heart sounds: Normal heart sounds.  Pulmonary:     Effort: Pulmonary effort is normal.     Breath sounds: Normal breath sounds.  Abdominal:     General: Bowel sounds are normal.     Palpations: Abdomen is soft. There  is no hepatomegaly, splenomegaly or mass.     Tenderness: There is no abdominal tenderness.     Hernia: No hernia is present.  Musculoskeletal:     Cervical back: Neck supple.     Right lower leg: No edema.     Left lower leg: No edema.     Right ankle: Tenderness present.  Skin:    Findings: No rash.  Neurological:     Mental Status: She is alert and oriented to person, place, and time.     Motor: No weakness.  Psychiatric:        Mood and Affect: Mood and affect normal.        Behavior: Behavior normal.     BP (!) 160/80 (BP Location: Left Arm, Patient Position: Sitting)   Pulse (!) 59   Ht 5\' 5"  (1.651 m)   Wt 180 lb 9.6 oz (81.9 kg)   LMP 09/03/2016 (Approximate)   BMI 30.05 kg/m  Wt Readings from Last 3 Encounters:  10/11/19 180 lb 9.6 oz (81.9 kg)  09/27/19 179 lb 9.6 oz (81.5 kg)  06/27/19 181 lb 3.2 oz (82.2 kg)    Health Maintenance Due  Topic Date Due  . Hepatitis C Screening  Never done  . COVID-19 Vaccine (1) Never done  . HIV Screening  Never done  . PAP SMEAR-Modifier  Never done  . INFLUENZA VACCINE  08/13/2019    There are no preventive care reminders to display for this patient.  CBC Latest Ref Rng & Units 06/29/2019 09/26/2017 08/26/2017  WBC 4.0 - 10.5 K/uL 4.7 8.3 5.2  Hemoglobin 12.0 - 15.0 g/dL 13.1 14.0 13.1  Hematocrit 36 - 46 % 38.1 40.2 37.9  Platelets 150 - 400 K/uL 296 378 325   CMP Latest Ref Rng & Units 06/29/2019 09/26/2017 08/26/2017  Glucose 70 - 99 mg/dL 109(H) 108(H) 102(H)  BUN 6  - 20 mg/dL 12 18 14   Creatinine 0.44 - 1.00 mg/dL 0.63 0.63 0.54  Sodium 135 - 145 mmol/L 136 136 135  Potassium 3.5 - 5.1 mmol/L 3.8 3.6 3.8  Chloride 98 - 111 mmol/L 104 101 106  CO2 22 - 32 mmol/L 22 25 22   Calcium 8.9 - 10.3 mg/dL 9.2 9.3 9.1  Total Protein 6.4 - 8.2 g/dL - - -  Total Bilirubin 0.2 - 1.0 mg/dL - - -  Alkaline Phos 50 - 136 Unit/L - - -  AST 15 - 37 Unit/L - - -  ALT 12 - 78 U/L - - -    No results found for: TSH Lab Results  Component Value Date   ALBUMIN 3.9 06/24/2012   ANIONGAP 10 06/29/2019   No results found for: CHOL, HDL, LDLCALC, CHOLHDL No results found for: TRIG No results found for: HGBA1C    ASSESSMENT & PLAN:   Problem List Items Addressed This Visit      Cardiovascular and Mediastinum   Mitral valve prolapse   Relevant Medications   metoprolol succinate (TOPROL-XL) 25 MG 24 hr tablet   lisinopril-hydrochlorothiazide (ZESTORETIC) 20-12.5 MG tablet   Essential hypertension - Primary   Relevant Medications   metoprolol succinate (TOPROL-XL) 25 MG 24 hr tablet   lisinopril-hydrochlorothiazide (ZESTORETIC) 20-12.5 MG tablet     Digestive   GERD (gastroesophageal reflux disease)     Other   Generalized anxiety disorder   Fluttering heart   Relevant Medications   metoprolol succinate (TOPROL-XL) 25 MG 24 hr tablet      Meds ordered this encounter  Medications  . metoprolol succinate (TOPROL-XL) 25 MG 24 hr tablet    Sig: Take 1 tablet (25 mg total) by mouth daily.    Dispense:  90 tablet    Refill:  3  . lisinopril-hydrochlorothiazide (ZESTORETIC) 20-12.5 MG tablet    Sig: Take 1 tablet by mouth daily.    Dispense:  90 tablet    Refill:  3    Follow-up: No follow-ups on file.    Cletis Athens, MD [facility] [address] [place], [place] [address]   By signing my name below, I, [employer], attest that this documentation has been prepared under the direction and in the presence of Dr. Cletis Athens Electronically Signed: Cletis Athens, MD 10/11/19, 7:23 PM   I personally performed the services described in this documentation, which was SCRIBED in my presence. The recorded information has been reviewed and considered accurate. It has been edited as necessary during review. Cletis Athens, MD

## 2019-10-11 NOTE — Assessment & Plan Note (Signed)
-   Patient experiencing high levels of anxiety.  - Encouraged patient to engage in relaxing activities like yoga, meditation, journaling, going for a walk, or participating in a hobby.  - Encouraged patient to reach out to trusted friends or family members about recent struggles 

## 2019-10-11 NOTE — Patient Instructions (Signed)

## 2019-10-11 NOTE — Assessment & Plan Note (Signed)
-   The patient's GERD is stable on medication.  - Instructed the patient to avoid eating spicy and acidic foods, as well as foods high in fat. - Instructed the patient to avoid eating large meals or meals 2-3 hours prior to sleeping. 

## 2019-10-11 NOTE — Assessment & Plan Note (Signed)
-   Today, the patient's blood pressure is not well managed on lisinopril. - The patient will continue the current treatment regimen.  - I encouraged the patient to eat a low-sodium diet to help control blood pressure. - I encouraged the patient to live an active lifestyle and complete activities that increases heart rate to 85% target heart rate at least 5 times per week for one hour.

## 2019-10-11 NOTE — Assessment & Plan Note (Signed)
Patient is known to have SVT which is under control.  She was advised to take metoprolol 25 mg p.o. daily.  I try to increase her dose to 50 mg at she gets bradycardia.  She was advised to drink a lot of water.  Advise any nasal decongestant product which contains ephedrine

## 2019-11-02 ENCOUNTER — Emergency Department
Admission: EM | Admit: 2019-11-02 | Discharge: 2019-11-02 | Disposition: A | Discharge status: Home / Self Care | Payer: BC Managed Care – PPO | Attending: Emergency Medicine | Admitting: Emergency Medicine

## 2019-11-02 ENCOUNTER — Encounter: Payer: Self-pay | Admitting: Emergency Medicine

## 2019-11-02 ENCOUNTER — Emergency Department: Payer: BC Managed Care – PPO

## 2019-11-02 ENCOUNTER — Other Ambulatory Visit: Payer: Self-pay

## 2019-11-02 DIAGNOSIS — Z91040 Latex allergy status: Secondary | ICD-10-CM | POA: Diagnosis not present

## 2019-11-02 DIAGNOSIS — R079 Chest pain, unspecified: Secondary | ICD-10-CM | POA: Insufficient documentation

## 2019-11-02 DIAGNOSIS — I10 Essential (primary) hypertension: Secondary | ICD-10-CM | POA: Insufficient documentation

## 2019-11-02 DIAGNOSIS — Z79899 Other long term (current) drug therapy: Secondary | ICD-10-CM | POA: Insufficient documentation

## 2019-11-02 DIAGNOSIS — F17200 Nicotine dependence, unspecified, uncomplicated: Secondary | ICD-10-CM | POA: Insufficient documentation

## 2019-11-02 LAB — CBC WITH DIFFERENTIAL/PLATELET
Abs Immature Granulocytes: 0.01 10*3/uL (ref 0.00–0.07)
Basophils Absolute: 0.1 10*3/uL (ref 0.0–0.1)
Basophils Relative: 1 %
Eosinophils Absolute: 0.2 10*3/uL (ref 0.0–0.5)
Eosinophils Relative: 3 %
HCT: 37.9 % (ref 36.0–46.0)
Hemoglobin: 12.4 g/dL (ref 12.0–15.0)
Immature Granulocytes: 0 %
Lymphocytes Relative: 44 %
Lymphs Abs: 2.6 10*3/uL (ref 0.7–4.0)
MCH: 27.6 pg (ref 26.0–34.0)
MCHC: 32.7 g/dL (ref 30.0–36.0)
MCV: 84.2 fL (ref 80.0–100.0)
Monocytes Absolute: 0.4 10*3/uL (ref 0.1–1.0)
Monocytes Relative: 7 %
Neutro Abs: 2.6 10*3/uL (ref 1.7–7.7)
Neutrophils Relative %: 45 %
Platelets: 300 10*3/uL (ref 150–400)
RBC: 4.5 MIL/uL (ref 3.87–5.11)
RDW: 13.2 % (ref 11.5–15.5)
WBC: 5.8 10*3/uL (ref 4.0–10.5)
nRBC: 0 % (ref 0.0–0.2)

## 2019-11-02 LAB — COMPREHENSIVE METABOLIC PANEL
ALT: 19 U/L (ref 0–44)
AST: 18 U/L (ref 15–41)
Albumin: 4 g/dL (ref 3.5–5.0)
Alkaline Phosphatase: 60 U/L (ref 38–126)
Anion gap: 8 (ref 5–15)
BUN: 19 mg/dL (ref 6–20)
CO2: 25 mmol/L (ref 22–32)
Calcium: 8.8 mg/dL — ABNORMAL LOW (ref 8.9–10.3)
Chloride: 105 mmol/L (ref 98–111)
Creatinine, Ser: 0.63 mg/dL (ref 0.44–1.00)
GFR, Estimated: >60 mL/min (ref >60)
Glucose, Bld: 108 mg/dL — ABNORMAL HIGH (ref 70–99)
Potassium: 3.9 mmol/L (ref 3.5–5.1)
Sodium: 138 mmol/L (ref 135–145)
Total Bilirubin: 0.6 mg/dL (ref 0.3–1.2)
Total Protein: 7.1 g/dL (ref 6.5–8.1)

## 2019-11-02 LAB — TROPONIN I (HIGH SENSITIVITY)
Troponin I (High Sensitivity): 3 ng/L (ref <18)
Troponin I (High Sensitivity): 3 ng/L (ref <18)

## 2019-11-02 MED ORDER — HYDROXYZINE HCL 25 MG PO TABS: 25.0000 mg | ORAL_TABLET | Freq: Three times a day (TID) | ORAL | 0 refills | Status: AC | PRN

## 2019-11-02 NOTE — ED Provider Notes (Signed)
[facility] Emergency Department Provider Note   ____________________________________________   I have reviewed the triage vital signs and the nursing notes.   HISTORY  Chief Complaint Chest Pain   History limited by: Not Limited   HPI April Guzman is a 44 y.o. female who presents to the emergency department today because of concern for chest pain. The patient states that it started last night around 330 in the morning. She describes it as intense squeezing located in the center of her chest. The patient also felt like she was having palpitations while this was going on. By the time of my exam her symptoms have improved. She states she has been very stressed recently and has not been sleeping well. The patient has been following up with her physicians and is currently wearing a Holter monitor. She says that she has been having similar episodes over the past 2 months. She did have an ablation performed years ago for SVT so has some concern that the symptoms are coming back.   Records reviewed. Per medical record review patient has a history of depression, SVT. Saw cardiology one week ago for similar concerns. Was in the ED 4 months ago for atypical chest pain.   Past Medical History:  Diagnosis Date  . Anemia   . Anxiety   . Depression   . Dysrhythmia    SVT, unsuccessful ablation  . Family history of adverse reaction to anesthesia    Mother hard to wake up  . Fluttering heart 09/27/2019  . Generalized anxiety disorder   . GERD (gastroesophageal reflux disease)   . Headache(784.0)   . Heart murmur   . Hypertension   . Hypoglycemia 12/11/10   "I've changed my diet to deal w/it and it works"  . Pars defect   . 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 Vitamin D deficiency     Patient Active Problem List   Diagnosis Date Noted  . Precordial pain 09/27/2019  . Generalized anxiety disorder   . Anxiety   . GERD (gastroesophageal  reflux disease)   . Essential hypertension 06/27/2019  . Concussion with no loss of consciousness 06/27/2019  . Obesity (BMI 30-39.9) 06/27/2019  . Pelvic pain 09/03/2017  . Menorrhagia 09/03/2017  . Neck fullness 12/17/2010  . Acidemia-bicarbonate 18 12/17/2010  . Hyperkalemia 12/17/2010  . Blood pressure elevated 10/10/2010  . Supraventricular tachycardia  07/01/2010  . Back pain-disabled 07/01/2010  . Mitral valve prolapse 07/01/2010    Past Surgical History:  Procedure Laterality Date  . CARDIAC ELECTROPHYSIOLOGY MAPPING AND ABLATION  12/11/10  . CESAREAN SECTION  2004; 2008  . DILATION AND CURETTAGE OF UTERUS    . LAPAROSCOPIC BILATERAL SALPINGECTOMY Bilateral 09/03/2017   Procedure: LAPAROSCOPIC BILATERAL SALPINGECTOMY;  Surgeon: Ward, Honor Loh, MD;  Location: [facility] ORS;  Service: Gynecology;  Laterality: Bilateral;  . LAPAROSCOPIC HYSTERECTOMY N/A 09/03/2017   Procedure: HYSTERECTOMY TOTAL LAPAROSCOPIC;  Surgeon: Ward, Honor Loh, MD;  Location: [facility] ORS;  Service: Gynecology;  Laterality: N/A;  . SUPRAVENTRICULAR TACHYCARDIA ABLATION N/A 12/11/2010   Procedure: SUPRAVENTRICULAR TACHYCARDIA ABLATION;  Surgeon: Evans Lance, MD;  Location: [facility] CATH LAB;  Service: Cardiovascular;  Laterality: N/A;  . TUBAL LIGATION      Prior to Admission medications   Medication Sig Start Date End Date Taking? Authorizing Provider  acetaminophen (TYLENOL) 500 MG tablet Take 1,000 mg by mouth 2 (two) times daily as needed for moderate pain or headache.  [provider]  CRANBERRY PO Take 1 tablet by mouth every evening.     [provider]  ferrous sulfate 325 (65 FE) MG EC tablet Take 650 mg by mouth every evening.     [provider]  ibuprofen (ADVIL,MOTRIN) 800 MG tablet Take 1 tablet (800 mg total) by mouth every 6 (six) hours. 09/03/17   Ward, Honor Loh, MD  lisinopril-hydrochlorothiazide (ZESTORETIC) 20-12.5 MG tablet Take 1 tablet by mouth daily. 10/11/19    Cletis Athens, MD  metoprolol succinate (TOPROL-XL) 25 MG 24 hr tablet Take 1 tablet (25 mg total) by mouth daily. 10/11/19   Cletis Athens, MD  omeprazole (PRILOSEC) 20 MG capsule Take 20 mg by mouth 2 (two) times daily.     [provider]  Vitamin D, Ergocalciferol, (DRISDOL) 50000 units CAPS capsule Take 50,000 Units by mouth every Saturday.  04/24/16   [provider]    Allergies Cinnamon, Morphine, Latex, and Macrobid [nitrofurantoin monohydrate macrocrystals]  Family History  Problem Relation Age of Onset  . Breast cancer Mother 70    Social History Social History   Tobacco Use  . Smoking status: Current Every Day Smoker    Packs/day: 0.00    Years: 0.00    Pack years: 0.00  . Smokeless tobacco: Never Used  . Tobacco comment: many years ago  Vaping Use  . Vaping Use: Never used  Substance Use Topics  . Alcohol use: No  . Drug use: No    Review of Systems Constitutional: No fever/chills Eyes: No visual changes. ENT: No sore throat. Cardiovascular: Positive for chest pain. Respiratory: Positive for shortness of breath. Gastrointestinal: No abdominal pain.  No nausea, no vomiting.  No diarrhea.   Genitourinary: Negative for dysuria. Musculoskeletal: Negative for back pain. Skin: Negative for rash. Neurological: Negative for headaches, focal weakness or numbness.  ____________________________________________   PHYSICAL EXAM:  VITAL SIGNS: ED Triage Vitals  Enc Vitals Group     BP 11/02/19 0509 (!) 159/81     Pulse Rate 11/02/19 0509 65     Resp 11/02/19 0509 18     Temp 11/02/19 0509 98.6 F (37 C)     Temp Source 11/02/19 0509 Oral     SpO2 11/02/19 0508 100 %     Weight 11/02/19 0510 181 lb (82.1 kg)     Height 11/02/19 0510 5\' 5"  (1.651 m)     Head Circumference --      Peak Flow --      Pain Score 11/02/19 0509 0   Constitutional: Alert and oriented.  Eyes: Conjunctivae are normal.  ENT      Head: Normocephalic and  atraumatic.      Nose: No congestion/rhinnorhea.      Mouth/Throat: Mucous membranes are moist.      Neck: No stridor. Hematological/Lymphatic/Immunilogical: No cervical lymphadenopathy. Cardiovascular: Normal rate, regular rhythm.  No murmurs, rubs, or gallops.  Respiratory: Normal respiratory effort without tachypnea nor retractions. Breath sounds are clear and equal bilaterally. No wheezes/rales/rhonchi. Gastrointestinal: Soft and non tender. No rebound. No guarding.  Genitourinary: Deferred Musculoskeletal: Normal range of motion in all extremities. No lower extremity edema. Neurologic:  Normal speech and language. No gross focal neurologic deficits are appreciated.  Skin:  Skin is warm, dry and intact. No rash noted. Psychiatric: Mood and affect are normal. Speech and behavior are normal. Patient exhibits appropriate insight and judgment.  ____________________________________________    LABS (pertinent positives/negatives)  Trop hs 3 to 3 CBC wbc 5.8,  hgb 12.4, plt 300 CMP wnl except glu 108, ca 8.8  ____________________________________________   EKG  I, Nance Pear, attending physician, personally viewed and interpreted this EKG  EKG Time: 0507 Rate: 57 Rhythm: sinus bradycardia Axis: normal Intervals: qtc 402 QRS: narrow ST changes: no st elevation Impression: abnormal ekg   ____________________________________________    RADIOLOGY  CXR  No acute abnoramlity  ____________________________________________   PROCEDURES  Procedures  ____________________________________________   INITIAL IMPRESSION / ASSESSMENT AND PLAN / ED COURSE  Pertinent labs & imaging results that were available during my care of the patient were reviewed by me and considered in my medical decision making (see chart for details).   Patient presented to the emergency department today because of concern for chest pain. Did feel better by time of my exam. The patient states that  she is being worked up for similar symptoms and currently has a holter monitor in place. Work up here without concerning findings. Troponin negative x 2. No concerning EKG findings. Will give patient prescription for atarax to help with anxiety and stress. Encouraged patient to continue outpatient work up. ____________________________________________   FINAL CLINICAL IMPRESSION(S) / ED DIAGNOSES  Final diagnoses:  Nonspecific chest pain     Note: This dictation was prepared with Dragon dictation. Any transcriptional errors that result from this process are unintentional     Nance Pear, MD 11/02/19 [phone]

## 2019-11-02 NOTE — ED Triage Notes (Addendum)
Pt to triage via w/c with no distress noted; EMS brought pt in from home; st awoke 74min PTA with left sided CP radiating into left shoulder; hx SVT with ablation and currently has holter monitor in place; st currently relief of pain; pt voices recent stressor, attending nursing school

## 2019-11-02 NOTE — Discharge Instructions (Signed)
Please seek medical attention for any high fevers, chest pain, shortness of breath, change in behavior, persistent vomiting, bloody stool or any other new or concerning symptoms.  

## 2019-11-02 NOTE — ED Notes (Signed)
See triage note, pt reports CP earlier today left sided that radiated to left shoulder. Denies SHOB, N/V, dizziness.  Pt in NAD, speaking in complete sentences, RR even and unlabored.

## 2019-11-02 NOTE — ED Notes (Signed)
Signature pad not available, pt verbalizes understanding of d.c instructions and when to return to ED. Denies questions or concerns.

## 2019-11-08 ENCOUNTER — Ambulatory Visit: Payer: BC Managed Care – PPO | Admitting: Internal Medicine

## 2019-12-18 ENCOUNTER — Ambulatory Visit: Admission: RE | Admit: 2019-12-18 | Payer: BC Managed Care – PPO | Source: Ambulatory Visit

## 2019-12-18 ENCOUNTER — Other Ambulatory Visit: Payer: Self-pay | Admitting: Internal Medicine

## 2019-12-18 DIAGNOSIS — R079 Chest pain, unspecified: Secondary | ICD-10-CM

## 2019-12-19 ENCOUNTER — Ambulatory Visit
Admission: RE | Admit: 2019-12-19 | Discharge: 2019-12-19 | Disposition: A | Discharge status: Home / Self Care | Payer: BC Managed Care – PPO | Source: Ambulatory Visit | Attending: Internal Medicine | Admitting: Internal Medicine

## 2019-12-19 ENCOUNTER — Other Ambulatory Visit: Payer: Self-pay

## 2019-12-19 DIAGNOSIS — R079 Chest pain, unspecified: Secondary | ICD-10-CM | POA: Insufficient documentation

## 2019-12-19 LAB — POCT I-STAT CREATININE: Creatinine, Ser: 0.6 mg/dL (ref 0.44–1.00)

## 2019-12-19 MED ORDER — IOHEXOL 350 MG/ML SOLN
75.0000 mL | Freq: Once | INTRAVENOUS | Status: AC | PRN
  Administered 2019-12-19: 75 mL via INTRAVENOUS

## 2020-02-16 ENCOUNTER — Other Ambulatory Visit
Admission: RE | Admit: 2020-02-16 | Discharge: 2020-02-16 | Disposition: A | Discharge status: Home / Self Care | Payer: BC Managed Care – PPO | Source: Ambulatory Visit | Attending: Physician Assistant | Admitting: Physician Assistant

## 2020-02-16 DIAGNOSIS — R0789 Other chest pain: Secondary | ICD-10-CM | POA: Insufficient documentation

## 2020-02-16 LAB — FIBRIN DERIVATIVES D-DIMER (ARMC ONLY): Fibrin derivatives D-dimer (ARMC): 228.26 ng{FEU}/mL (ref 0.00–499.00)

## 2020-04-25 ENCOUNTER — Other Ambulatory Visit: Payer: Self-pay | Admitting: Internal Medicine

## 2020-04-25 ENCOUNTER — Other Ambulatory Visit (HOSPITAL_COMMUNITY): Payer: Self-pay | Admitting: Internal Medicine

## 2020-04-25 DIAGNOSIS — R2241 Localized swelling, mass and lump, right lower limb: Secondary | ICD-10-CM

## 2020-04-26 ENCOUNTER — Ambulatory Visit (HOSPITAL_BASED_OUTPATIENT_CLINIC_OR_DEPARTMENT_OTHER)
Admission: RE | Admit: 2020-04-26 | Discharge: 2020-04-26 | Disposition: A | Discharge status: Home / Self Care | Payer: Medicare Other | Source: Ambulatory Visit | Attending: Internal Medicine | Admitting: Internal Medicine

## 2020-04-26 ENCOUNTER — Other Ambulatory Visit: Payer: Self-pay

## 2020-04-26 DIAGNOSIS — R2241 Localized swelling, mass and lump, right lower limb: Secondary | ICD-10-CM

## 2020-05-28 ENCOUNTER — Other Ambulatory Visit: Payer: Self-pay | Admitting: Internal Medicine

## 2020-05-28 DIAGNOSIS — Z1231 Encounter for screening mammogram for malignant neoplasm of breast: Secondary | ICD-10-CM

## 2020-06-25 ENCOUNTER — Ambulatory Visit
Admission: RE | Admit: 2020-06-25 | Discharge: 2020-06-25 | Disposition: A | Discharge status: Home / Self Care | Payer: Medicare Other | Source: Ambulatory Visit | Attending: Internal Medicine | Admitting: Internal Medicine

## 2020-06-25 ENCOUNTER — Other Ambulatory Visit: Payer: Self-pay

## 2020-06-25 DIAGNOSIS — Z1231 Encounter for screening mammogram for malignant neoplasm of breast: Secondary | ICD-10-CM | POA: Insufficient documentation

## 2020-07-02 ENCOUNTER — Other Ambulatory Visit: Payer: Self-pay | Admitting: Internal Medicine

## 2020-07-02 DIAGNOSIS — R928 Other abnormal and inconclusive findings on diagnostic imaging of breast: Secondary | ICD-10-CM

## 2020-07-02 DIAGNOSIS — N6489 Other specified disorders of breast: Secondary | ICD-10-CM

## 2020-07-08 ENCOUNTER — Ambulatory Visit
Admission: RE | Admit: 2020-07-08 | Discharge: 2020-07-08 | Disposition: A | Discharge status: Home / Self Care | Payer: Medicare Other | Source: Ambulatory Visit | Attending: Internal Medicine | Admitting: Internal Medicine

## 2020-07-08 ENCOUNTER — Other Ambulatory Visit: Payer: Self-pay

## 2020-07-08 DIAGNOSIS — R928 Other abnormal and inconclusive findings on diagnostic imaging of breast: Secondary | ICD-10-CM

## 2020-07-08 DIAGNOSIS — N6489 Other specified disorders of breast: Secondary | ICD-10-CM | POA: Diagnosis present

## 2021-04-19 IMAGING — CR DG CHEST 2V
1 series · 2 of 2 positions shown · non-contrast
Comparison: 06/29/2019

CLINICAL DATA: Left-sided chest pain extending to the left
shoulder.

EXAM:
CHEST - 2 VIEW

[Series 1: dg chest 2 view · 0.14mm/px · 2 of 2 slices shown]
[im 1/2]
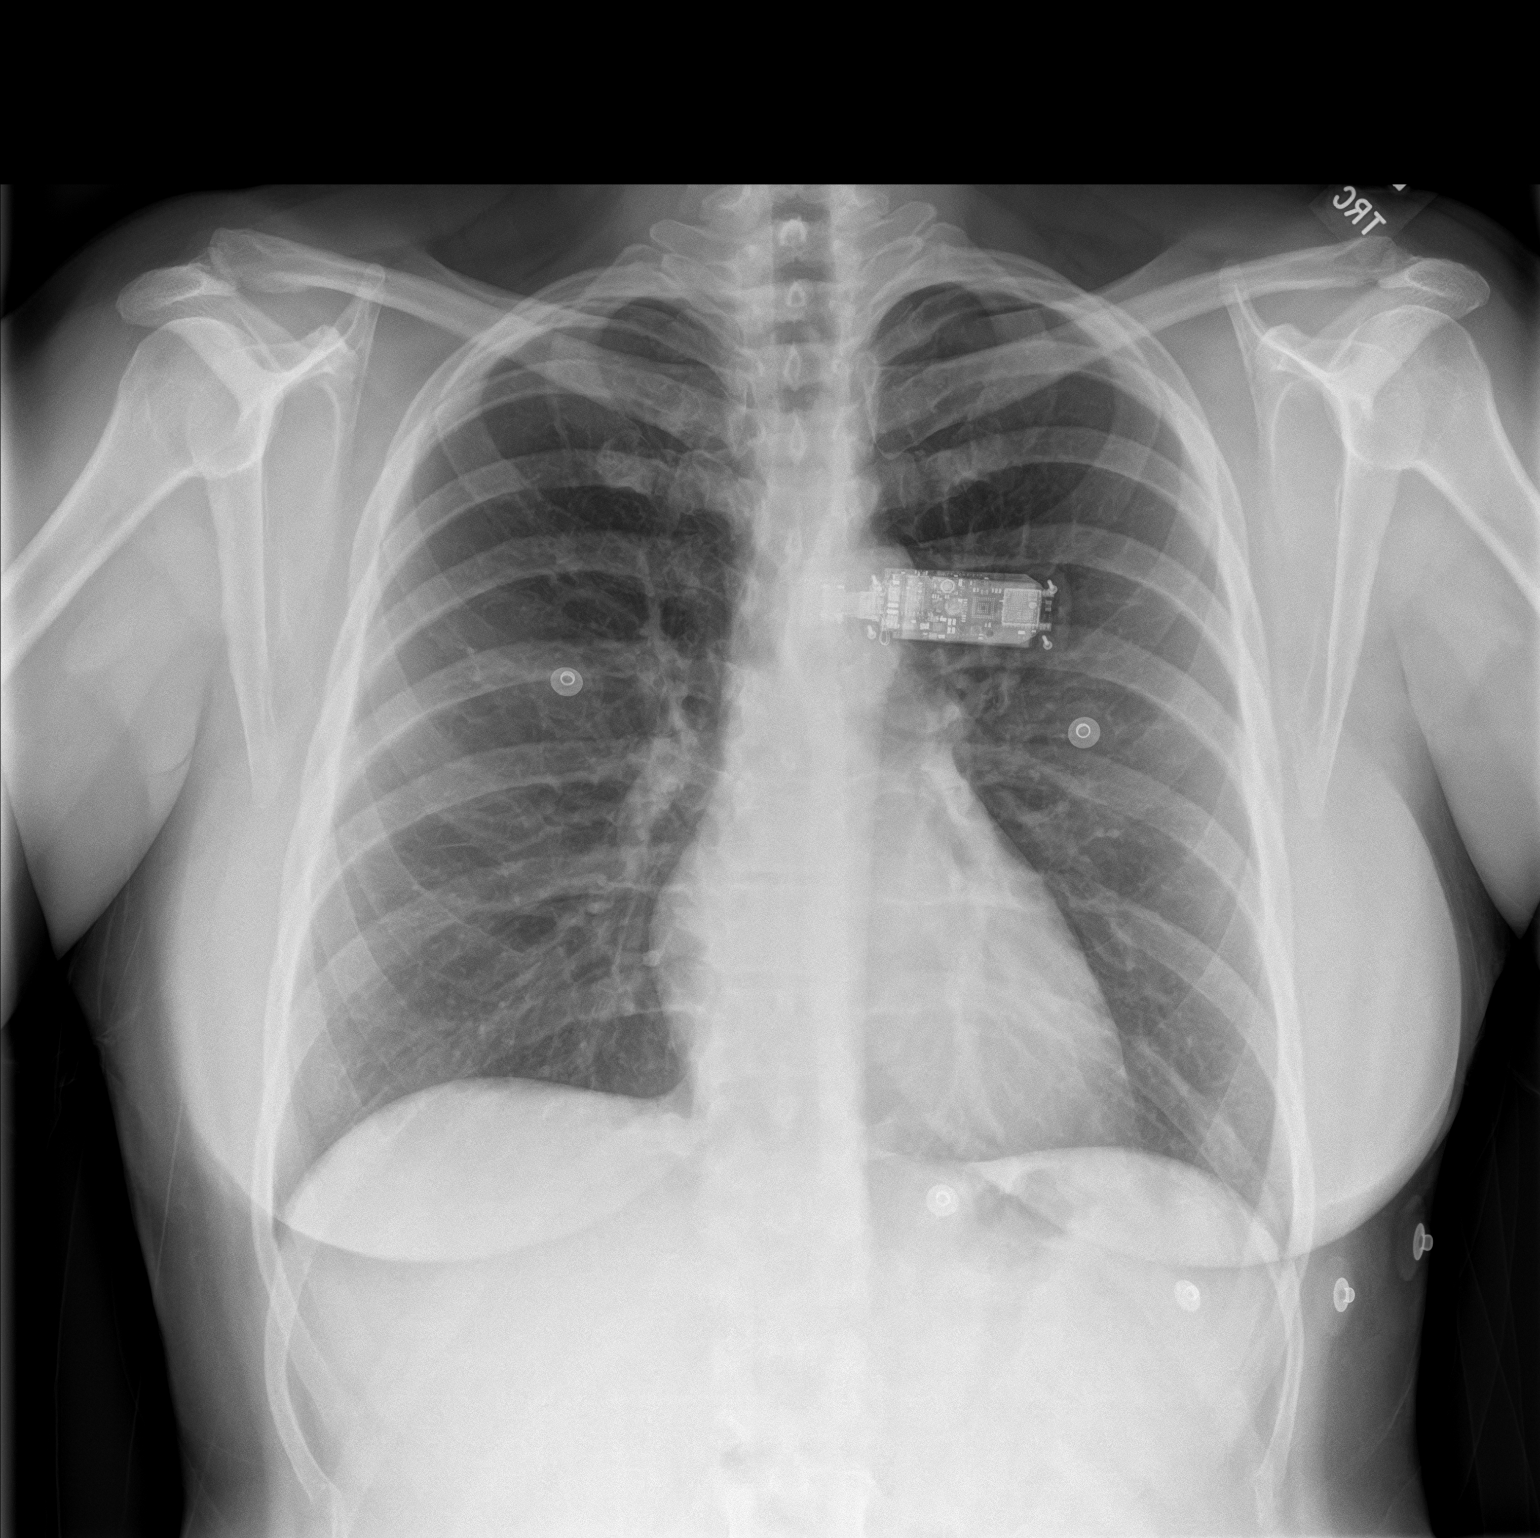
[im 2/2]
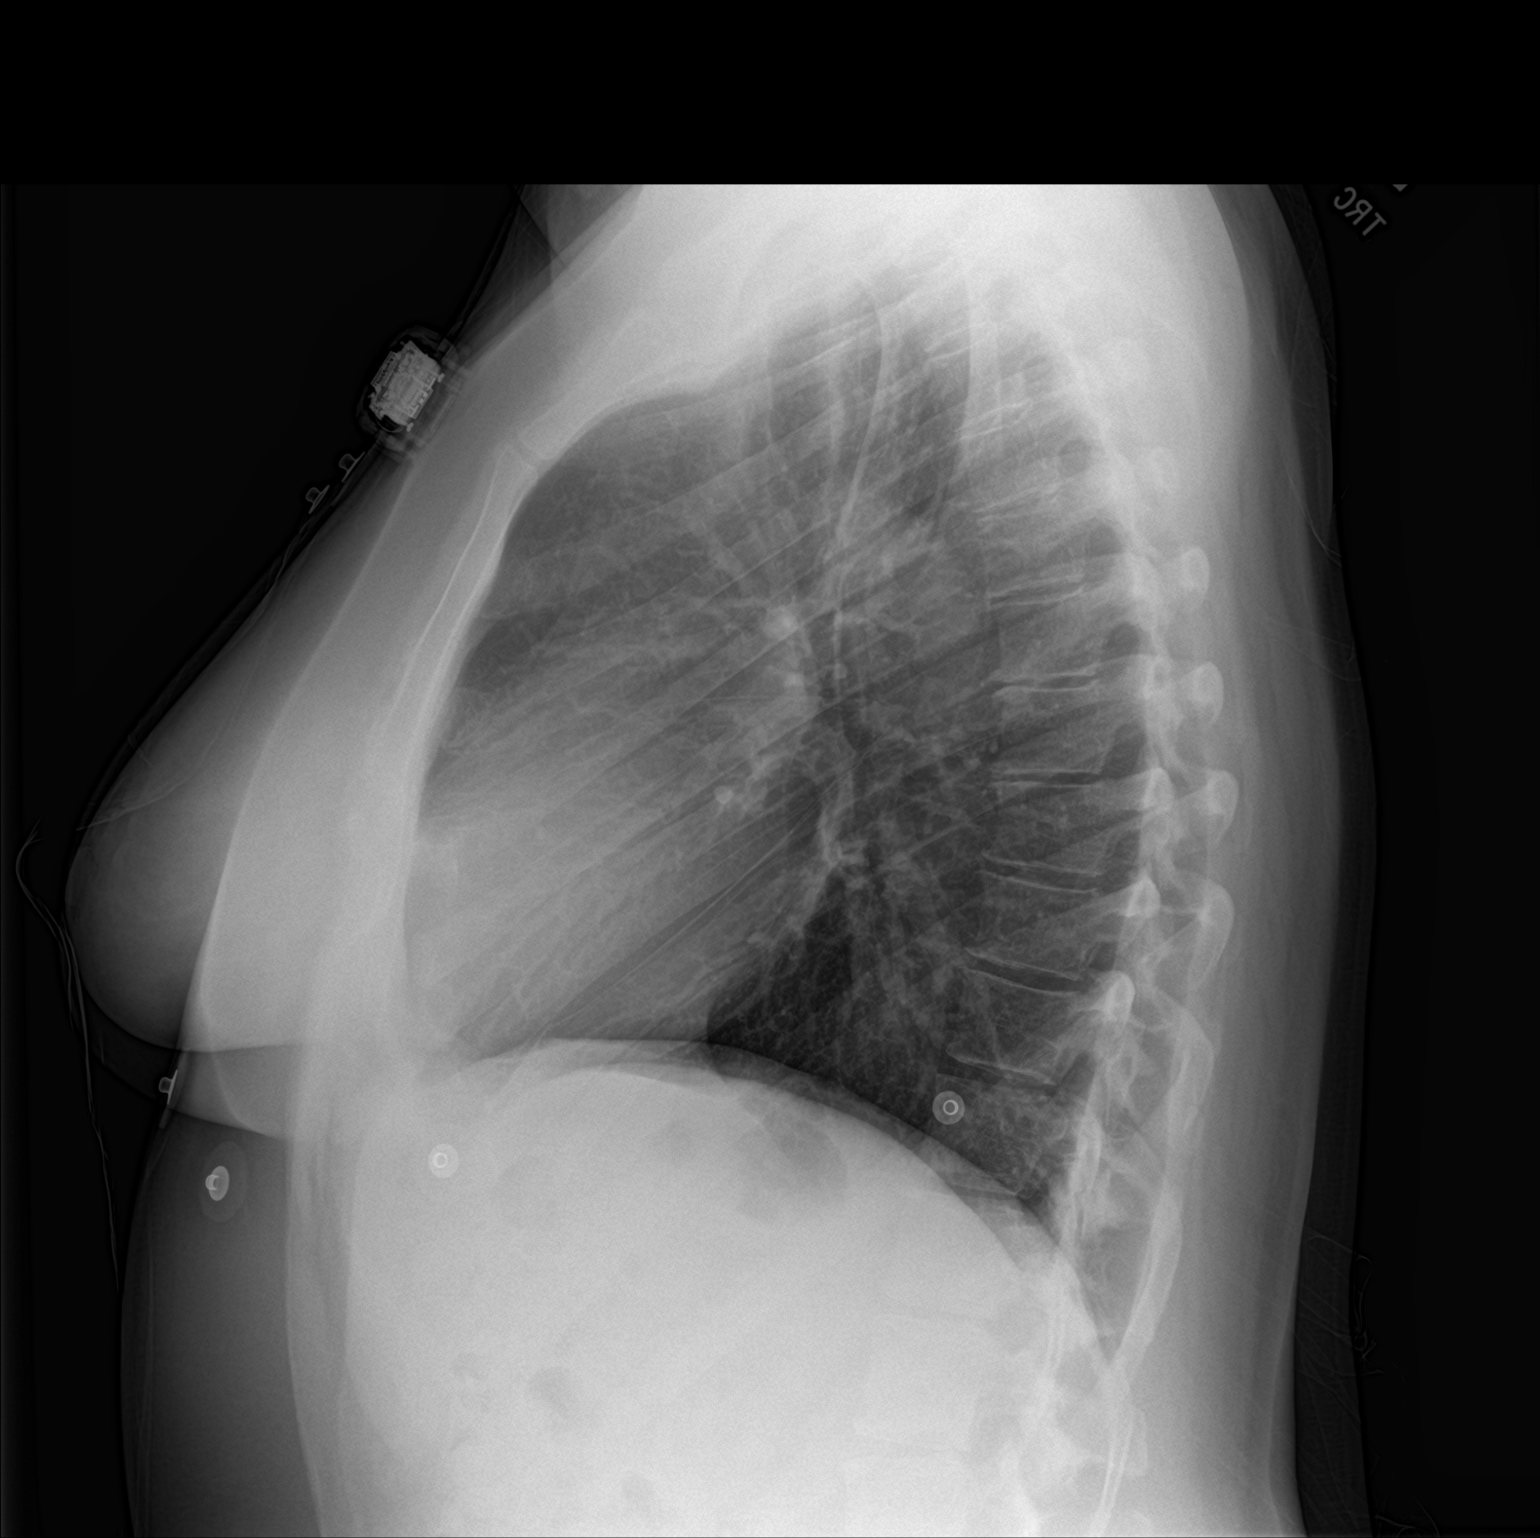

[2 of 2 positions shown; findings below may reference images not displayed]

FINDINGS: Adlaho monitor. Heart size and pulmonary vascularity are normal.
Lungs are clear. No pleural effusions. No pneumothorax. Mediastinal
contours appear intact.
IMPRESSION: No active cardiopulmonary disease.

## 2021-08-01 ENCOUNTER — Other Ambulatory Visit: Payer: Self-pay | Admitting: Internal Medicine

## 2021-08-01 DIAGNOSIS — Z1231 Encounter for screening mammogram for malignant neoplasm of breast: Secondary | ICD-10-CM

## 2021-11-04 ENCOUNTER — Other Ambulatory Visit: Payer: Self-pay

## 2021-11-04 MED ORDER — VERAPAMIL HCL 40 MG PO TABS
40.0000 mg | ORAL_TABLET | Freq: Two times a day (BID) | ORAL | 1 refills | Status: AC
  Filled 2021-11-04: qty 40, 20d supply, fill #0
  Filled 2021-11-05: qty 20, 10d supply, fill #0

## 2021-11-05 ENCOUNTER — Other Ambulatory Visit: Payer: Self-pay

## 2021-12-11 IMAGING — MG MM DIGITAL SCREENING BILAT W/ TOMO AND CAD
6 of 10 series · 6 of 30 positions shown · non-contrast
Comparison: Previous exam(s).

CLINICAL DATA: Screening.

EXAM:
DIGITAL SCREENING BILATERAL MAMMOGRAM WITH TOMOSYNTHESIS AND CAD
TECHNIQUE: Bilateral screening digital craniocaudal and mediolateral oblique
mammograms were obtained. Bilateral screening digital breast
tomosynthesis was performed. The images were evaluated with
computer-aided detection.

[R CC synth-2D]
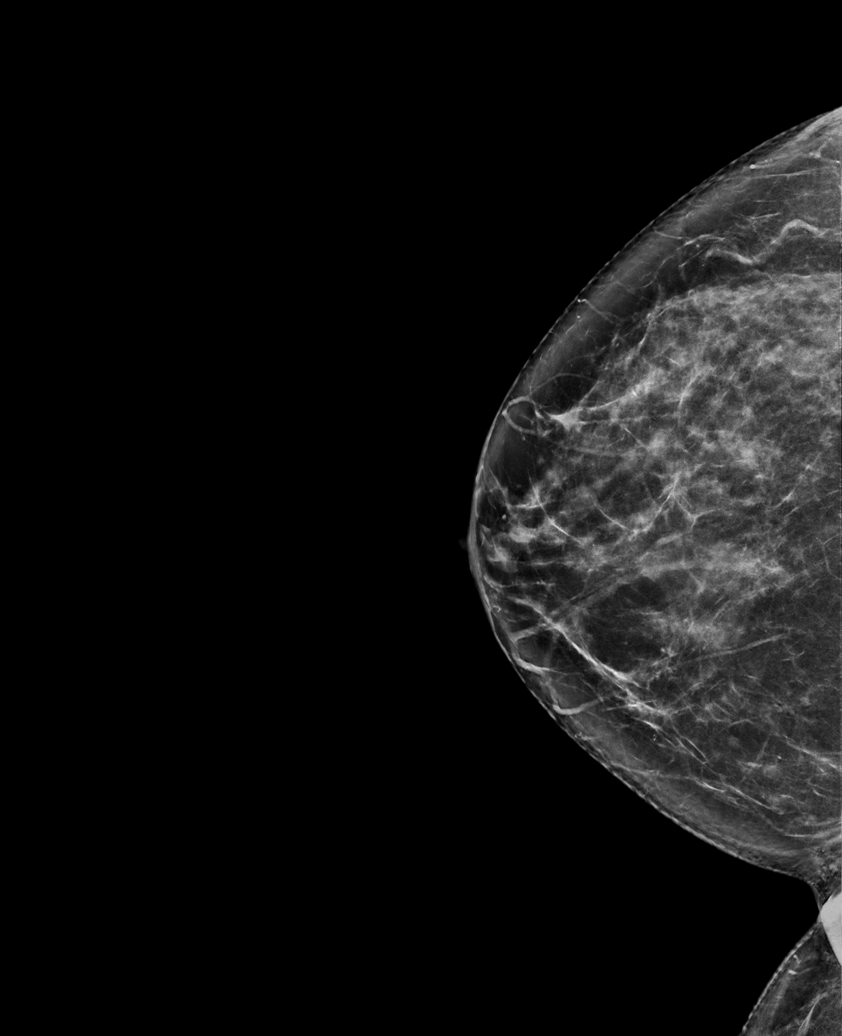

[L CC synth-2D]
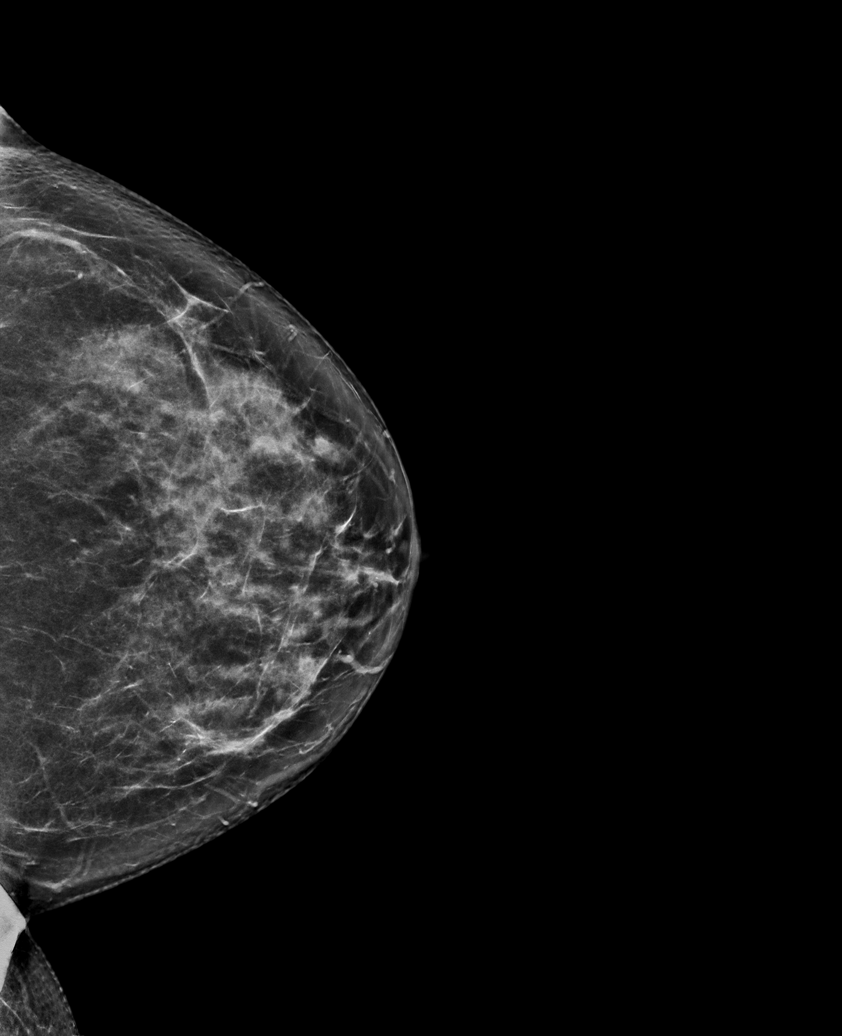

[L MLO synth-2D]
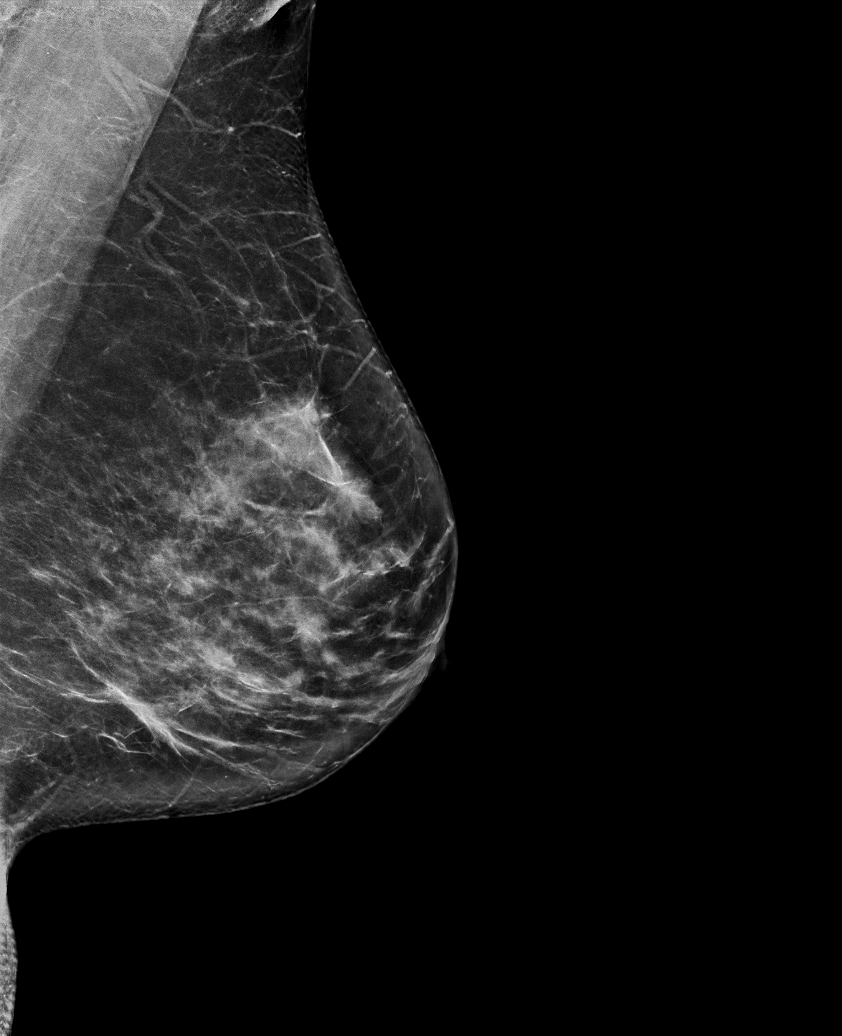

[R MLO synth-2D (1 of 2)]
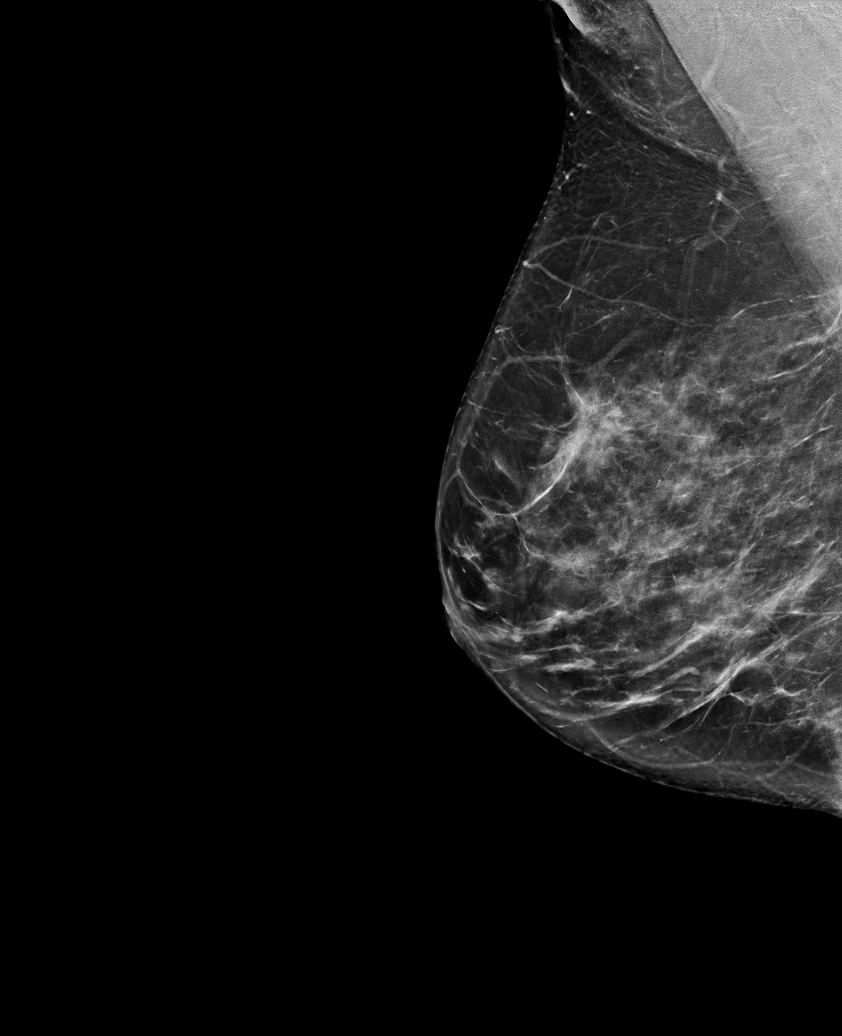

[R MLO synth-2D (2 of 2)]
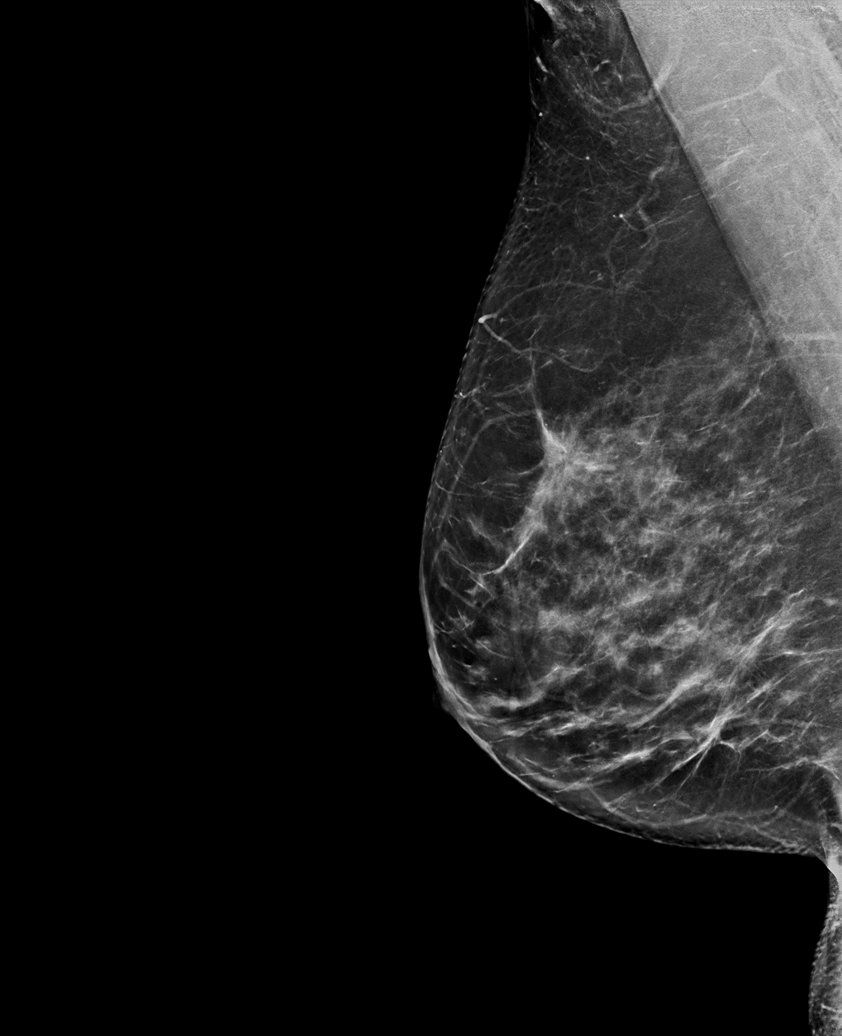

[L MLO tomo · tomo slice 44/87.0]
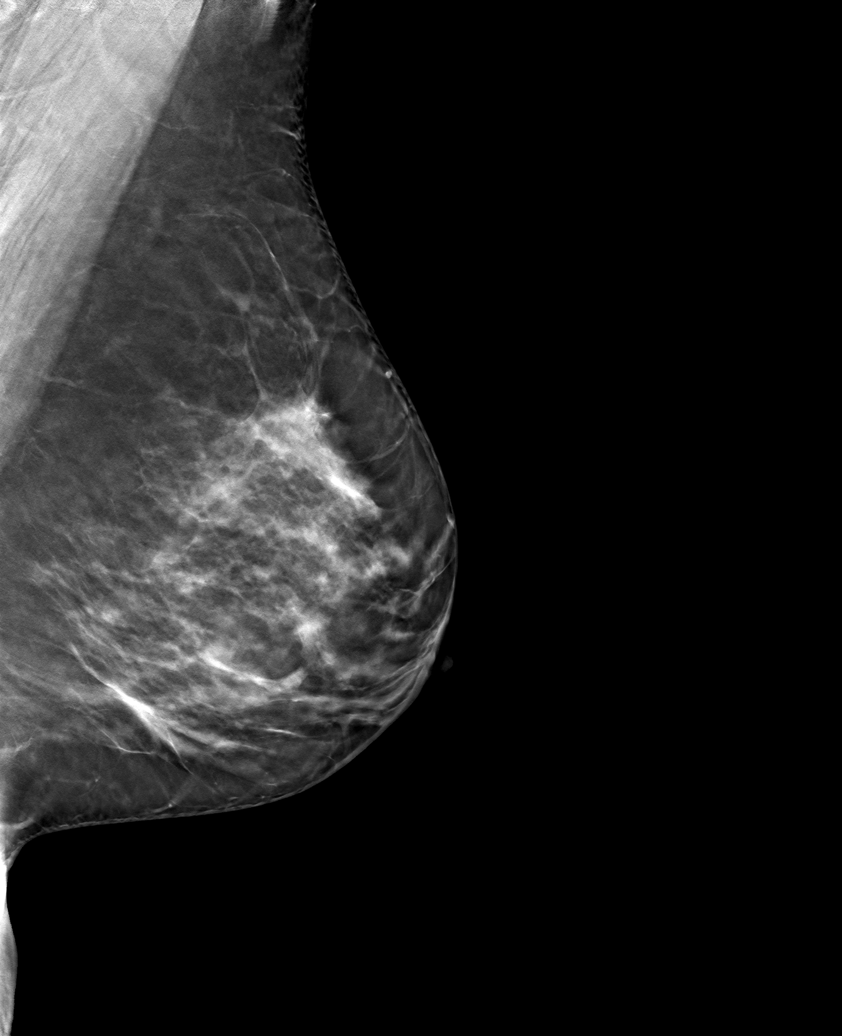

[6 of 30 positions shown; findings below may reference images not displayed]

ACR Breast Density Category c: The breast tissue is heterogeneously
dense, which may obscure small masses.
FINDINGS: In the left breast, a possible asymmetry warrants further
evaluation. In the right breast, no findings suspicious for
malignancy.
IMPRESSION: Further evaluation is suggested for possible asymmetry in the left
breast.

RECOMMENDATION:
Diagnostic mammogram and possibly ultrasound of the left breast.
(Code:[id number]-[id number])

The patient will be contacted regarding the findings, and additional
imaging will be scheduled.

BI-RADS CATEGORY  0: Incomplete. Need additional imaging evaluation
and/or prior mammograms for comparison.

## 2022-01-19 ENCOUNTER — Other Ambulatory Visit: Payer: Self-pay

## 2022-01-19 DIAGNOSIS — I10 Essential (primary) hypertension: Secondary | ICD-10-CM | POA: Diagnosis not present

## 2022-01-19 MED ORDER — METOPROLOL TARTRATE 25 MG PO TABS
25.0000 mg | ORAL_TABLET | Freq: Two times a day (BID) | ORAL | 3 refills | Status: DC
  Filled 2022-01-19: qty 180, 90d supply, fill #0
  Filled 2022-05-23: qty 180, 90d supply, fill #1

## 2022-01-26 ENCOUNTER — Other Ambulatory Visit: Payer: Self-pay

## 2022-01-26 MED ORDER — WEGOVY 1 MG/0.5ML ~~LOC~~ SOAJ
1.0000 mg | SUBCUTANEOUS | 1 refills | Status: AC
  Filled 2022-01-26: qty 2, 28d supply, fill #0

## 2022-01-27 ENCOUNTER — Other Ambulatory Visit: Payer: Self-pay

## 2022-01-29 ENCOUNTER — Other Ambulatory Visit: Payer: Self-pay

## 2022-02-03 ENCOUNTER — Other Ambulatory Visit: Payer: Self-pay

## 2022-02-03 MED ORDER — METFORMIN HCL ER 500 MG PO TB24
500.0000 mg | ORAL_TABLET | Freq: Every day | ORAL | 2 refills | Status: DC
  Filled 2022-02-03: qty 30, 30d supply, fill #0
  Filled 2022-03-03: qty 30, 30d supply, fill #1

## 2022-02-13 ENCOUNTER — Other Ambulatory Visit: Payer: Self-pay

## 2022-03-17 ENCOUNTER — Other Ambulatory Visit: Payer: Self-pay

## 2022-03-26 ENCOUNTER — Other Ambulatory Visit: Payer: Self-pay | Admitting: Family Medicine

## 2022-03-26 ENCOUNTER — Other Ambulatory Visit: Payer: Self-pay

## 2022-03-26 ENCOUNTER — Ambulatory Visit
Admission: RE | Admit: 2022-03-26 | Discharge: 2022-03-26 | Disposition: A | Discharge status: Home / Self Care | Payer: Commercial Managed Care - PPO | Source: Ambulatory Visit | Attending: Family Medicine | Admitting: Family Medicine

## 2022-03-26 DIAGNOSIS — R110 Nausea: Secondary | ICD-10-CM | POA: Diagnosis not present

## 2022-03-26 DIAGNOSIS — R10A1 Flank pain, right side: Secondary | ICD-10-CM

## 2022-03-26 DIAGNOSIS — R109 Unspecified abdominal pain: Secondary | ICD-10-CM | POA: Diagnosis not present

## 2022-03-26 DIAGNOSIS — R112 Nausea with vomiting, unspecified: Secondary | ICD-10-CM

## 2022-03-26 MED ORDER — CYCLOBENZAPRINE HCL 10 MG PO TABS
10.0000 mg | ORAL_TABLET | Freq: Every evening | ORAL | 0 refills | Status: AC | PRN
  Filled 2022-03-26: qty 10, 10d supply, fill #0

## 2022-03-26 MED ORDER — IOHEXOL 300 MG/ML  SOLN
100.0000 mL | Freq: Once | INTRAMUSCULAR | Status: AC | PRN
  Administered 2022-03-26: 100 mL via INTRAVENOUS

## 2022-03-26 MED ORDER — MELOXICAM 15 MG PO TABS
15.0000 mg | ORAL_TABLET | Freq: Every day | ORAL | 0 refills | Status: AC
  Filled 2022-03-26: qty 14, 14d supply, fill #0

## 2022-04-07 ENCOUNTER — Other Ambulatory Visit: Payer: Self-pay

## 2022-04-07 MED ORDER — QSYMIA 7.5-46 MG PO CP24
1.0000 | ORAL_CAPSULE | Freq: Every day | ORAL | 0 refills | Status: DC
  Filled 2022-04-07 – 2022-04-20 (×3): qty 30, 30d supply, fill #0

## 2022-04-08 ENCOUNTER — Other Ambulatory Visit: Payer: Self-pay

## 2022-04-10 ENCOUNTER — Other Ambulatory Visit: Payer: Self-pay

## 2022-04-20 ENCOUNTER — Other Ambulatory Visit: Payer: Self-pay

## 2022-04-21 ENCOUNTER — Other Ambulatory Visit: Payer: Self-pay

## 2022-04-22 ENCOUNTER — Other Ambulatory Visit: Payer: Self-pay

## 2022-05-08 ENCOUNTER — Other Ambulatory Visit: Payer: Self-pay

## 2022-05-08 DIAGNOSIS — F330 Major depressive disorder, recurrent, mild: Secondary | ICD-10-CM | POA: Diagnosis not present

## 2022-05-08 DIAGNOSIS — R739 Hyperglycemia, unspecified: Secondary | ICD-10-CM | POA: Diagnosis not present

## 2022-05-08 DIAGNOSIS — I471 Supraventricular tachycardia, unspecified: Secondary | ICD-10-CM | POA: Diagnosis not present

## 2022-05-08 DIAGNOSIS — I10 Essential (primary) hypertension: Secondary | ICD-10-CM | POA: Diagnosis not present

## 2022-05-08 MED ORDER — QSYMIA 7.5-46 MG PO CP24
1.0000 | ORAL_CAPSULE | Freq: Every day | ORAL | 0 refills | Status: DC
  Filled 2022-05-23: qty 30, 30d supply, fill #0

## 2022-05-24 ENCOUNTER — Other Ambulatory Visit: Payer: Self-pay

## 2022-05-26 ENCOUNTER — Other Ambulatory Visit: Payer: Self-pay

## 2022-07-06 ENCOUNTER — Other Ambulatory Visit: Payer: Self-pay

## 2022-07-07 ENCOUNTER — Other Ambulatory Visit: Payer: Self-pay

## 2022-07-07 MED ORDER — QSYMIA 7.5-46 MG PO CP24
1.0000 | ORAL_CAPSULE | Freq: Every day | ORAL | 0 refills | Status: DC
  Filled 2022-07-07: qty 30, 30d supply, fill #0

## 2022-07-08 ENCOUNTER — Other Ambulatory Visit: Payer: Self-pay

## 2022-07-10 ENCOUNTER — Other Ambulatory Visit (HOSPITAL_COMMUNITY): Payer: Self-pay

## 2022-07-30 DIAGNOSIS — I10 Essential (primary) hypertension: Secondary | ICD-10-CM | POA: Diagnosis not present

## 2022-07-30 DIAGNOSIS — R739 Hyperglycemia, unspecified: Secondary | ICD-10-CM | POA: Diagnosis not present

## 2022-08-05 ENCOUNTER — Other Ambulatory Visit: Payer: Self-pay

## 2022-08-05 MED ORDER — ESTRADIOL 0.1 MG/GM VA CREA
TOPICAL_CREAM | VAGINAL | 1 refills | Status: AC
  Filled 2022-08-06: qty 42.5, 90d supply, fill #0
  Filled 2022-12-13: qty 42.5, 90d supply, fill #1

## 2022-08-05 MED ORDER — ERGOCALCIFEROL 1.25 MG (50000 UT) PO CAPS
50000.0000 [IU] | ORAL_CAPSULE | ORAL | 1 refills | Status: AC
  Filled 2022-08-05: qty 12, 84d supply, fill #0
  Filled 2022-12-13: qty 12, 84d supply, fill #1

## 2022-08-06 ENCOUNTER — Other Ambulatory Visit: Payer: Self-pay

## 2022-08-10 ENCOUNTER — Other Ambulatory Visit: Payer: Self-pay | Admitting: Internal Medicine

## 2022-08-10 ENCOUNTER — Other Ambulatory Visit: Payer: Self-pay

## 2022-08-10 DIAGNOSIS — Z1231 Encounter for screening mammogram for malignant neoplasm of breast: Secondary | ICD-10-CM

## 2022-08-11 ENCOUNTER — Other Ambulatory Visit: Payer: Self-pay

## 2022-08-11 MED ORDER — QSYMIA 7.5-46 MG PO CP24
1.0000 | ORAL_CAPSULE | Freq: Every day | ORAL | 0 refills | Status: DC
Start: 2022-08-11 — End: 2022-10-05
  Filled 2022-08-11 – 2022-08-31 (×6): qty 30, 30d supply, fill #0

## 2022-08-12 ENCOUNTER — Other Ambulatory Visit: Payer: Self-pay

## 2022-08-17 ENCOUNTER — Other Ambulatory Visit: Payer: Self-pay

## 2022-08-18 ENCOUNTER — Other Ambulatory Visit: Payer: Self-pay

## 2022-08-19 ENCOUNTER — Other Ambulatory Visit: Payer: Self-pay

## 2022-08-20 ENCOUNTER — Other Ambulatory Visit: Payer: Self-pay

## 2022-08-21 ENCOUNTER — Other Ambulatory Visit: Payer: Self-pay

## 2022-08-31 ENCOUNTER — Other Ambulatory Visit: Payer: Self-pay

## 2022-09-01 ENCOUNTER — Other Ambulatory Visit: Payer: Self-pay

## 2022-09-02 ENCOUNTER — Ambulatory Visit
Admission: RE | Admit: 2022-09-02 | Discharge: 2022-09-02 | Disposition: A | Discharge status: Home / Self Care | Payer: Commercial Managed Care - PPO | Source: Ambulatory Visit | Attending: Internal Medicine | Admitting: Internal Medicine

## 2022-09-02 DIAGNOSIS — Z1231 Encounter for screening mammogram for malignant neoplasm of breast: Secondary | ICD-10-CM | POA: Diagnosis not present

## 2022-10-01 ENCOUNTER — Other Ambulatory Visit: Payer: Self-pay

## 2022-10-01 DIAGNOSIS — J209 Acute bronchitis, unspecified: Secondary | ICD-10-CM | POA: Diagnosis not present

## 2022-10-01 DIAGNOSIS — R053 Chronic cough: Secondary | ICD-10-CM | POA: Diagnosis not present

## 2022-10-01 MED ORDER — AZITHROMYCIN 250 MG PO TABS
ORAL_TABLET | ORAL | 0 refills | Status: AC
  Filled 2022-10-01: qty 6, 5d supply, fill #0

## 2022-10-01 MED ORDER — BENZONATATE 200 MG PO CAPS
200.0000 mg | ORAL_CAPSULE | Freq: Three times a day (TID) | ORAL | 0 refills | Status: AC
  Filled 2022-10-01: qty 20, 7d supply, fill #0

## 2022-10-01 MED ORDER — PREDNISONE 20 MG PO TABS
ORAL_TABLET | ORAL | 0 refills | Status: DC
  Filled 2022-10-01: qty 11, 9d supply, fill #0

## 2022-10-05 ENCOUNTER — Other Ambulatory Visit: Payer: Self-pay

## 2022-10-05 MED ORDER — QSYMIA 7.5-46 MG PO CP24
1.0000 | ORAL_CAPSULE | Freq: Every day | ORAL | 0 refills | Status: DC
  Filled 2022-10-05: qty 30, 30d supply, fill #0

## 2022-10-06 ENCOUNTER — Other Ambulatory Visit: Payer: Self-pay

## 2022-11-05 DIAGNOSIS — F330 Major depressive disorder, recurrent, mild: Secondary | ICD-10-CM | POA: Diagnosis not present

## 2022-11-05 DIAGNOSIS — R635 Abnormal weight gain: Secondary | ICD-10-CM | POA: Diagnosis not present

## 2022-11-05 DIAGNOSIS — E538 Deficiency of other specified B group vitamins: Secondary | ICD-10-CM | POA: Diagnosis not present

## 2022-11-05 DIAGNOSIS — I10 Essential (primary) hypertension: Secondary | ICD-10-CM | POA: Diagnosis not present

## 2022-11-05 DIAGNOSIS — R053 Chronic cough: Secondary | ICD-10-CM | POA: Diagnosis not present

## 2023-03-05 DIAGNOSIS — H9201 Otalgia, right ear: Secondary | ICD-10-CM | POA: Diagnosis not present

## 2023-03-05 DIAGNOSIS — I10 Essential (primary) hypertension: Secondary | ICD-10-CM | POA: Diagnosis not present

## 2023-03-05 DIAGNOSIS — F330 Major depressive disorder, recurrent, mild: Secondary | ICD-10-CM | POA: Diagnosis not present

## 2023-05-12 ENCOUNTER — Other Ambulatory Visit: Payer: Self-pay

## 2023-05-13 ENCOUNTER — Other Ambulatory Visit: Payer: Self-pay

## 2023-05-13 MED ORDER — METOPROLOL TARTRATE 25 MG PO TABS
25.0000 mg | ORAL_TABLET | Freq: Two times a day (BID) | ORAL | 3 refills | Status: AC
  Filled 2023-05-13: qty 180, 90d supply, fill #0
  Filled 2024-02-04: qty 180, 90d supply, fill #1

## 2024-02-04 ENCOUNTER — Other Ambulatory Visit: Payer: Self-pay

## 2024-02-07 ENCOUNTER — Other Ambulatory Visit: Payer: Self-pay

## 2024-02-07 MED ORDER — METOPROLOL TARTRATE 25 MG PO TABS: 25.0000 mg | ORAL_TABLET | Freq: Two times a day (BID) | ORAL | 0 refills | Status: AC

## 2024-02-08 ENCOUNTER — Other Ambulatory Visit (HOSPITAL_COMMUNITY): Payer: Self-pay
